# Patient Record
Sex: Male | Born: 1975
Health system: Southern US, Community
[De-identification: ages and names within clinical notes are randomized; demographics above are authoritative.]

## PROBLEM LIST (undated history)

## (undated) DIAGNOSIS — K4090 Unilateral inguinal hernia, without obstruction or gangrene, not specified as recurrent: Secondary | ICD-10-CM

---

## 2003-12-23 ENCOUNTER — Emergency Department (HOSPITAL_COMMUNITY): Admission: EM | Admit: 2003-12-23 | Discharge: 2003-12-23 | Payer: Self-pay | Admitting: Emergency Medicine

## 2016-02-04 ENCOUNTER — Emergency Department (HOSPITAL_COMMUNITY)
Admission: EM | Admit: 2016-02-04 | Discharge: 2016-02-05 | Disposition: A | Discharge status: Home / Self Care | Payer: 59 | Attending: Emergency Medicine | Admitting: Emergency Medicine

## 2016-02-04 ENCOUNTER — Encounter (HOSPITAL_COMMUNITY): Payer: Self-pay | Admitting: *Deleted

## 2016-02-04 DIAGNOSIS — F17200 Nicotine dependence, unspecified, uncomplicated: Secondary | ICD-10-CM | POA: Insufficient documentation

## 2016-02-04 DIAGNOSIS — R1013 Epigastric pain: Secondary | ICD-10-CM | POA: Insufficient documentation

## 2016-02-04 DIAGNOSIS — R112 Nausea with vomiting, unspecified: Secondary | ICD-10-CM | POA: Diagnosis present

## 2016-02-04 DIAGNOSIS — R1010 Upper abdominal pain, unspecified: Secondary | ICD-10-CM | POA: Diagnosis not present

## 2016-02-04 DIAGNOSIS — R634 Abnormal weight loss: Secondary | ICD-10-CM | POA: Insufficient documentation

## 2016-02-04 DIAGNOSIS — R197 Diarrhea, unspecified: Secondary | ICD-10-CM | POA: Diagnosis not present

## 2016-02-04 LAB — COMPREHENSIVE METABOLIC PANEL
ALT: 18 U/L (ref 17–63)
AST: 19 U/L (ref 15–41)
Albumin: 4.1 g/dL (ref 3.5–5.0)
Alkaline Phosphatase: 55 U/L (ref 38–126)
Anion gap: 8 (ref 5–15)
BUN: 6 mg/dL (ref 6–20)
CO2: 25 mmol/L (ref 22–32)
Calcium: 8.9 mg/dL (ref 8.9–10.3)
Chloride: 106 mmol/L (ref 101–111)
Creatinine, Ser: 0.93 mg/dL (ref 0.61–1.24)
GFR calc Af Amer: >60 mL/min (ref >60)
GFR calc non Af Amer: >60 mL/min (ref >60)
Glucose, Bld: 119 mg/dL — ABNORMAL HIGH (ref 65–99)
Potassium: 3.8 mmol/L (ref 3.5–5.1)
Sodium: 139 mmol/L (ref 135–145)
Total Bilirubin: 0.5 mg/dL (ref 0.3–1.2)
Total Protein: 6.9 g/dL (ref 6.5–8.1)

## 2016-02-04 LAB — CBC
HCT: 40.5 % (ref 39.0–52.0)
Hemoglobin: 13.2 g/dL (ref 13.0–17.0)
MCH: 30.2 pg (ref 26.0–34.0)
MCHC: 32.6 g/dL (ref 30.0–36.0)
MCV: 92.7 fL (ref 78.0–100.0)
Platelets: 216 10*3/uL (ref 150–400)
RBC: 4.37 MIL/uL (ref 4.22–5.81)
RDW: 13.2 % (ref 11.5–15.5)
WBC: 8.9 10*3/uL (ref 4.0–10.5)

## 2016-02-04 LAB — URINALYSIS, ROUTINE W REFLEX MICROSCOPIC
BILIRUBIN URINE: NEGATIVE
GLUCOSE, UA: NEGATIVE mg/dL
KETONES UR: NEGATIVE mg/dL
Leukocytes, UA: NEGATIVE
Nitrite: NEGATIVE
PROTEIN: NEGATIVE mg/dL
Specific Gravity, Urine: 1.002 — ABNORMAL LOW (ref 1.005–1.030)
pH: 6.5 (ref 5.0–8.0)

## 2016-02-04 LAB — URINE MICROSCOPIC-ADD ON
BACTERIA UA: NONE SEEN
WBC, UA: NONE SEEN WBC/hpf (ref 0–5)

## 2016-02-04 LAB — LIPASE, BLOOD: Lipase: 25 U/L (ref 11–51)

## 2016-02-04 MED ORDER — SODIUM CHLORIDE 0.9 % IV BOLUS (SEPSIS)
500.0000 mL | Freq: Once | INTRAVENOUS | Status: AC
  Administered 2016-02-05: 500 mL via INTRAVENOUS

## 2016-02-04 NOTE — ED Notes (Signed)
The pt is c/o abd pain with nausea and diarrhea since march 1st he is actually better now minimal pain he reports feeling something moving in his abd.  He also wants a tet shot  Because he gets small cuts all the time and it been A LONG TIME since he had one

## 2016-02-05 ENCOUNTER — Encounter (HOSPITAL_COMMUNITY): Payer: Self-pay | Admitting: Radiology

## 2016-02-05 ENCOUNTER — Emergency Department (HOSPITAL_COMMUNITY): Payer: 59

## 2016-02-05 MED ORDER — IOHEXOL 300 MG/ML  SOLN
100.0000 mL | Freq: Once | INTRAMUSCULAR | Status: AC | PRN
  Administered 2016-02-05: 100 mL via INTRAVENOUS

## 2016-02-05 MED ORDER — HYDROXYZINE HCL 25 MG PO TABS: 25.0000 mg | ORAL_TABLET | Freq: Three times a day (TID) | ORAL | Status: DC | PRN

## 2016-02-05 MED ORDER — CHLORDIAZEPOXIDE HCL 25 MG PO CAPS: ORAL_CAPSULE | ORAL | Status: DC

## 2016-02-05 MED ORDER — ONDANSETRON 4 MG PO TBDP: 4.0000 mg | ORAL_TABLET | Freq: Three times a day (TID) | ORAL | Status: DC | PRN

## 2016-02-05 NOTE — Discharge Instructions (Signed)
Abdominal Pain, Adult °Many things can cause abdominal pain. Usually, abdominal pain is not caused by a disease and will improve without treatment. It can often be observed and treated at home. Your health care provider will do a physical exam and possibly order blood tests and X-rays to help determine the seriousness of your pain. However, in many cases, more time must pass before a clear cause of the pain can be found. Before that point, your health care provider may not know if you need more testing or further treatment. °HOME CARE INSTRUCTIONS °Monitor your abdominal pain for any changes. The following actions may help to alleviate any discomfort you are experiencing: °· Only take over-the-counter or prescription medicines as directed by your health care provider. °· Do not take laxatives unless directed to do so by your health care provider. °· Try a clear liquid diet (broth, tea, or water) as directed by your health care provider. Slowly move to a bland diet as tolerated. °SEEK MEDICAL CARE IF: °· You have unexplained abdominal pain. °· You have abdominal pain associated with nausea or diarrhea. °· You have pain when you urinate or have a bowel movement. °· You experience abdominal pain that wakes you in the night. °· You have abdominal pain that is worsened or improved by eating food. °· You have abdominal pain that is worsened with eating fatty foods. °· You have a fever. °SEEK IMMEDIATE MEDICAL CARE IF: °· Your pain does not go away within 2 hours. °· You keep throwing up (vomiting). °· Your pain is felt only in portions of the abdomen, such as the right side or the left lower portion of the abdomen. °· You pass bloody or black tarry stools. °MAKE SURE YOU: °· Understand these instructions. °· Will watch your condition. °· Will get help right away if you are not doing well or get worse. °  °This information is not intended to replace advice given to you by your health care provider. Make sure you discuss  any questions you have with your health care provider. °  °Document Released: 08/04/2005 Document Revised: 07/16/2015 Document Reviewed: 07/04/2013 °Elsevier Interactive Patient Education ©2016 Elsevier Inc. ° °Nausea and Vomiting °Nausea is a sick feeling that often comes before throwing up (vomiting). Vomiting is a reflex where stomach contents come out of your mouth. Vomiting can cause severe loss of body fluids (dehydration). Children and elderly adults can become dehydrated quickly, especially if they also have diarrhea. Nausea and vomiting are symptoms of a condition or disease. It is important to find the cause of your symptoms. °CAUSES  °· Direct irritation of the stomach lining. This irritation can result from increased acid production (gastroesophageal reflux disease), infection, food poisoning, taking certain medicines (such as nonsteroidal anti-inflammatory drugs), alcohol use, or tobacco use. °· Signals from the brain. These signals could be caused by a headache, heat exposure, an inner ear disturbance, increased pressure in the brain from injury, infection, a tumor, or a concussion, pain, emotional stimulus, or metabolic problems. °· An obstruction in the gastrointestinal tract (bowel obstruction). °· Illnesses such as diabetes, hepatitis, gallbladder problems, appendicitis, kidney problems, cancer, sepsis, atypical symptoms of a heart attack, or eating disorders. °· Medical treatments such as chemotherapy and radiation. °· Receiving medicine that makes you sleep (general anesthetic) during surgery. °DIAGNOSIS °Your caregiver may ask for tests to be done if the problems do not improve after a few days. Tests may also be done if symptoms are severe or if the reason for the   nausea and vomiting is not clear. Tests may include:  Urine tests.  Blood tests.  Stool tests.  Cultures (to look for evidence of infection).  X-rays or other imaging studies. Test results can help your caregiver make  decisions about treatment or the need for additional tests. TREATMENT You need to stay well hydrated. Drink frequently but in small amounts.You may wish to drink water, sports drinks, clear broth, or eat frozen ice pops or gelatin dessert to help stay hydrated.When you eat, eating slowly may help prevent nausea.There are also some antinausea medicines that may help prevent nausea. HOME CARE INSTRUCTIONS   Take all medicine as directed by your caregiver.  If you do not have an appetite, do not force yourself to eat. However, you must continue to drink fluids.  If you have an appetite, eat a normal diet unless your caregiver tells you differently.  Eat a variety of complex carbohydrates (rice, wheat, potatoes, bread), lean meats, yogurt, fruits, and vegetables.  Avoid high-fat foods because they are more difficult to digest.  Drink enough water and fluids to keep your urine clear or pale yellow.  If you are dehydrated, ask your caregiver for specific rehydration instructions. Signs of dehydration may include:  Severe thirst.  Dry lips and mouth.  Dizziness.  Dark urine.  Decreasing urine frequency and amount.  Confusion.  Rapid breathing or pulse. SEEK IMMEDIATE MEDICAL CARE IF:   You have blood or brown flecks (like coffee grounds) in your vomit.  You have black or bloody stools.  You have a severe headache or stiff neck.  You are confused.  You have severe abdominal pain.  You have chest pain or trouble breathing.  You do not urinate at least once every 8 hours.  You develop cold or clammy skin.  You continue to vomit for longer than 24 to 48 hours.  You have a fever. MAKE SURE YOU:   Understand these instructions.  Will watch your condition.  Will get help right away if you are not doing well or get worse.   This information is not intended to replace advice given to you by your health care provider. Make sure you discuss any questions you have with  your health care provider.   Document Released: 10/25/2005 Document Revised: 01/17/2012 Document Reviewed: 03/24/2011 Elsevier Interactive Patient Education 2016 [employer].   [employer] Guide Outpatient Counseling/Substance Abuse Adult The United Ways 211 is a great source of information about community services available.  Access by dialing 2-1-1 from anywhere in [place], or by website -  PooledIncome.pl.   Other Local Resources (Updated 11/2015)  Crisis Hotlines   Services     Area Served  [employer]  Crisis Hotline, available 24 hours a day, 7 days a week: (734) 380-6346 [facility], [place]   [facility]  Crisis Hotline, available 24 hours a day, 7 days a week: (847)566-8980 [facility], [place]  [facility]  Suicide Prevention Hotline, available 24 hours a day, 7 days a week: 906-560-3587 [facility], [place]  [employer], available 24 hours a day, 7 days a week: 907-629-8364 [facility], [place]   [facility] Access to [employer], available 24 hours a day, 7 days a week: 365-589-7668 All   [facility]  Crisis Hotline, available 24 hours a day, 7 days a week: 6263448709 All   Other Local Resources (Updated 11/2015)  Outpatient Counseling/ Substance Abuse Programs  Services     Address  and Phone Number  ADS ([facility]cohol and Drug Services)   Options include Individual counseling, group counseling, intensive outpatient program (several hours a day, several days a week)  Offers depression assessments  Provides methadone maintenance program [phone] [address] [place], [place] [address]   [facility]   Offers partial hospitalization/day treatment and DUI/DWI programs  [employer], private insurance [phone] [address] [place], [place] [address]  [facility]    Services include intensive outpatient program  (several hours a day, several days a week), outpatient treatment, DUI/DWI services, family education  Also has some services specifically for [employer] transitional housing  [phone] [address] [place], [place] [address]     [facility]  [employer], private pay, and private insurance [phone] [address] [place], [place] [address]  [employer] [facility]  Services include individual counseling, substance abuse intensive outpatient program (several hours a day, several days a week), day treatment  Delene Loll, Medicaid, private insurance [phone] [address] [place], [place] [address]  [facility]   Offers substance abuse intensive outpatient program (several hours a day, several days a week), partial hospitalization program [phone] [address] [place], [place] [address]  [phone] [address] [place], [place] [address]  [phone] [address] [place], [place] [address]  [phone] [id number] [place], [place] [address]  [facility]  Individual counseling only  Accepts private insurance only [phone] [address] [place], [place] [address]  [facility]  Methadone maintenance program [phone] [address] [place], [place] [address]  [facility]  Walk-In Clinic providing substance abuse and mental health counseling  Accepts Medicaid, Medicare, private insurance  Offers sliding scale for uninsured [phone] [address] [place], [place]   [facility] [place], [employer].  Offers individual counseling, and intensive in-home services [phone] [address] [place], [place] [address]  [facility] individual counseling, family counseling, group therapy, domestic violence counseling, consumer credit  counseling  Accepts Medicare, Medicaid, private insurance  Offers sliding scale for uninsured [phone] [address] [place], [place] [address]  [phone] [facility], [address] [place], [place] [address]  [facility]  Offers individual, family and group counseling  3 locations - [place], [place], and [place]  [phone]  [address] [place], [place] [address]  [address] [place], [place] [address]  [address] [place], [place] [address]  [facility]    Offers psychiatric assessment, 8-week Intensive Outpatient Program (several hours a day, several times a week, daytime or evenings), early recovery group, family Program, medication management  Private pay or private insurance only [phone], or  [phone] [address] [place], [place] [address]  [facility] [employer] individual, couples and family counseling  Accepts Medicaid, private insurance, and sliding scale for uninsured [phone] [address] [place], [place] [address]  Len Blalock, MD  Individual counseling  Private insurance [phone] [address] [place], [place] [address]  [facility]   Offers assessment, substance abuse treatment, and behavioral health treatment [phone] [address] [place], [place] [address]  [facility]  Individual counseling  Accepts private insurance [phone] [address] [place], [place] [address]  [facility]  Individual counseling  Delene Loll, private insurance [phone] [address] [place], [place]  [phone]  [facility]    Offers intensive outpatient program (several hours a day, several times a week)  Private pay, private insurance [phone] [facility] [address] [place], [place]  [facility]  Individual counseling  Medicare, private insurance [phone] [address], [address] [place], [place] [address]  [facility]    Offers intensive outpatient program (several hours a day, several times a week) and partial hospitalization program [phone] [address] [place], [place] [address]  Emerson MonteParrish McKinney, MD  Individual counseling [phone] [address] [place], [place] [address]  [facility]  Offers Christian counseling to individuals, couples, and families  Accepts Medicare and private insurance; offers sliding scale for uninsured [phone] [address] [place], [place] [address]  [facility] [phone] [address] [place], [place] [address]  [facility]   Offers crisis counseling, individual counseling, group therapy, in-home therapy, domestic violence services, day treatment, DWI services, Administrator, artsCommunity Support Team (CST), Doctor, hospitalAssertive Community Treatment Team (ACTT), substance abuse Intensive Outpatient Program (several hours a day, several times a week)  2 locations - [place] and [place] [phone] [address] [place], [place] [address]  [phone] [address] [place], [place] [address]  [facility]     Individual counseling and group therapy  [employer] private insurance, [place]icare, [place] [phone] [address] [place], [place]  [facility]  Offers individual and family counseling  Offers LGBTQ services  Accepts private insurance and private pay [phone] [address] [place], [place] [id number]  [facility]    Offers individual counseling, group therapy, and outpatient detox  Accepts private insurance [phone] [address] [place], [place]  [facility]  Individual counseling  Accepts Medicare, private insurance [phone] [address] [place], [place] [address]   [facility]  Individual counseling  Accepts Medicare, private insurance [phone] [address] [place], [place] [address]  [facility]   Offers substance abuse Intensive Outpatient Program (several hours a day, several times a week) [phone], or [phone] [address] [place], [place]

## 2016-02-05 NOTE — ED Provider Notes (Signed)
CSN: [id number]     Arrival date & time 02/04/16  2144 History   First MD Initiated Contact with Patient 02/04/16 2348     Chief Complaint  Patient presents with  . Abdominal Pain     Patient is a 40 y.o. male presenting with abdominal pain. The history is provided by the patient.  Abdominal Pain Associated symptoms: diarrhea, nausea and vomiting   Associated symptoms: no chest pain and no shortness of breath   Patient presents with upper abdominal pain. States he's had nausea and diarrhea over the last 3 weeks the pain began today. Pain is in her upper abdomen. States he had felt constipated but now has more diarrhea. States he's had nausea and dry heaves. No fevers or chills. States he has felt fatigue. Pain is dull and in the upper abdomen. It is somewhat worse with eating. He states that over the last couple years he has lost 50 pounds. States he has not been trying. States he has had some depression has not been eating as much though. States he probably is drinking more than he should also.Marland Kitchen  History reviewed. No pertinent past medical history. History reviewed. No pertinent past surgical history. No family history on file. Social History  Substance Use Topics  . Smoking status: Current Every Day Smoker  . Smokeless tobacco: None  . Alcohol Use: Yes    Review of Systems  Constitutional: Positive for appetite change and unexpected weight change. Negative for activity change.  Eyes: Negative for pain.  Respiratory: Negative for chest tightness and shortness of breath.   Cardiovascular: Negative for chest pain and leg swelling.  Gastrointestinal: Positive for nausea, vomiting, abdominal pain and diarrhea.  Genitourinary: Negative for flank pain.  Musculoskeletal: Negative for back pain and neck stiffness.  Skin: Negative for rash.  Neurological: Negative for weakness, numbness and headaches.  Psychiatric/Behavioral: Negative for behavioral problems.      Allergies  Review of  patient's allergies indicates no known allergies.  Home Medications   Prior to Admission medications   Not on File   BP 109/70 mmHg  Pulse 63  Temp(Src) 98.1 F (36.7 C) (Oral)  Resp 20  Wt 173 lb (78.472 kg)  SpO2 100% Physical Exam  Constitutional: He appears well-developed and well-nourished.  HENT:  Head: Atraumatic.  Neck: Neck supple.  Cardiovascular: Normal rate.   Pulmonary/Chest: Effort normal.  Abdominal: There is tenderness.  Epigastric tenderness with some right upper quadrant fullness. No rebound or guarding.  Musculoskeletal: He exhibits no edema.  Neurological: He is alert.  Skin: Skin is warm.    ED Course  Procedures (including critical care time) Labs Review Labs Reviewed  COMPREHENSIVE METABOLIC PANEL - Abnormal; Notable for the following:    Glucose, Bld 119 (*)    All other components within normal limits  URINALYSIS, ROUTINE W REFLEX MICROSCOPIC (NOT AT [facility]) - Abnormal; Notable for the following:    APPearance CLOUDY (*)    Specific Gravity, Urine 1.002 (*)    Hgb urine dipstick TRACE (*)    All other components within normal limits  URINE MICROSCOPIC-ADD ON - Abnormal; Notable for the following:    Squamous Epithelial / LPF 0-5 (*)    All other components within normal limits  LIPASE, BLOOD  CBC    Imaging Review No results found. I have personally reviewed and evaluated these images and lab results as part of my medical decision-making.   EKG Interpretation None      MDM  Final diagnoses:  Nausea vomiting and diarrhea  Epigastric pain    Patient with nausea vomiting diarrhea. Epigastric abdominal pain with some fullness in the right upper quadrant. He is slender and this may just be his liver, but with unexpected weight loss of 50 pounds will get CT scan. Care turned over to Dr. Elesa MassedWard.    Benjiman CoreNathan Anastasija Anfinson, MD 02/05/16 [phone]

## 2016-12-06 ENCOUNTER — Encounter (HOSPITAL_COMMUNITY): Payer: Self-pay

## 2016-12-06 DIAGNOSIS — L03211 Cellulitis of face: Secondary | ICD-10-CM | POA: Insufficient documentation

## 2016-12-06 DIAGNOSIS — F17200 Nicotine dependence, unspecified, uncomplicated: Secondary | ICD-10-CM | POA: Insufficient documentation

## 2016-12-06 DIAGNOSIS — L0100 Impetigo, unspecified: Secondary | ICD-10-CM | POA: Diagnosis not present

## 2016-12-06 DIAGNOSIS — L0889 Other specified local infections of the skin and subcutaneous tissue: Secondary | ICD-10-CM | POA: Diagnosis present

## 2016-12-06 NOTE — ED Triage Notes (Signed)
Pt states hx of staph infection on nose. Pt with redness and swelling to bridge of nose. Pt states recurrent infection. Pt complaining of pain to nose. Pt denies any new injury/trauma. Pt unsure if scratched nose in sleep.

## 2016-12-07 ENCOUNTER — Emergency Department (HOSPITAL_COMMUNITY)
Admission: EM | Admit: 2016-12-07 | Discharge: 2016-12-07 | Disposition: A | Discharge status: Home / Self Care | Payer: 59 | Attending: Emergency Medicine | Admitting: Emergency Medicine

## 2016-12-07 DIAGNOSIS — L0100 Impetigo, unspecified: Secondary | ICD-10-CM

## 2016-12-07 DIAGNOSIS — L03211 Cellulitis of face: Secondary | ICD-10-CM

## 2016-12-07 MED ORDER — CEPHALEXIN 500 MG PO CAPS: 500.0000 mg | ORAL_CAPSULE | Freq: Three times a day (TID) | ORAL | 0 refills | Status: DC

## 2016-12-07 MED ORDER — CEPHALEXIN 250 MG PO CAPS
500.0000 mg | ORAL_CAPSULE | Freq: Once | ORAL | Status: AC
  Administered 2016-12-07: 500 mg via ORAL
  Filled 2016-12-07: qty 2

## 2016-12-07 MED ORDER — MUPIROCIN CALCIUM 2 % NA OINT: TOPICAL_OINTMENT | NASAL | 0 refills | Status: DC

## 2016-12-07 MED ORDER — SULFAMETHOXAZOLE-TRIMETHOPRIM 800-160 MG PO TABS
1.0000 | ORAL_TABLET | Freq: Once | ORAL | Status: AC
  Administered 2016-12-07: 1 via ORAL
  Filled 2016-12-07: qty 1

## 2016-12-07 MED ORDER — SULFAMETHOXAZOLE-TRIMETHOPRIM 800-160 MG PO TABS: 1.0000 | ORAL_TABLET | Freq: Two times a day (BID) | ORAL | 0 refills | Status: AC

## 2016-12-07 NOTE — ED Provider Notes (Signed)
[facility]-EMERGENCY DEPT Provider Note   CSN: [id number] Arrival date & time: 12/06/16  2333  By signing my name below, I, Arianna Nassar, attest that this documentation has been prepared under the direction and in the presence of Shon Baton, MD.  Electronically Signed: Octavia Heir, ED Scribe. 12/07/16. 3:09 AM.    History   Chief Complaint Chief Complaint  Patient presents with  . Skin Infection (of nose)    The history is provided by the patient. No language interpreter was used.   HPI Comments: Craig Fitzgerald is a 41 y.o. male who presents to the Emergency Department complaining of a moderate wound infection the nose that began last night. He describes the sensation as itching and burning. There is associated erythema and mild edema to the area. 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 He notes this new onset of infection would not go away when peroxide was applied. There has not been any new trauma or injury to the area. Pt denies fever, chest pain, shortness of breath, or lesions to other parts of the body  History reviewed. No pertinent past medical history.  There are no active problems to display for this patient.   History reviewed. No pertinent surgical history.     Home Medications    Prior to Admission medications   Medication Sig Start Date End Date Taking? Authorizing Provider  cephALEXin (KEFLEX) 500 MG capsule Take 1 capsule (500 mg total) by mouth 3 (three) times daily. 12/07/16   Shon Baton, MD  chlordiazePOXIDE (LIBRIUM) 25 MG capsule 50mg  PO TID x 1D, then 25-50mg  PO BID X 1D, then 25-50mg  PO QD X 1D 02/05/16   Danelle Berry, PA-C  hydrOXYzine (ATARAX/VISTARIL) 25 MG tablet Take 1 tablet (25 mg total) by mouth every 8 (eight) hours as needed for anxiety, nausea or vomiting. 02/05/16   Danelle Berry, PA-C  mupirocin nasal ointment (BACTROBAN) 2 % Apply in each nostril daily 12/07/16   Shon Baton, MD  ondansetron (ZOFRAN ODT) 4 MG disintegrating tablet Take 1 tablet (4 mg total) by mouth every 8 (eight) hours as needed for nausea or vomiting. 02/05/16   Danelle Berry, PA-C  sulfamethoxazole-trimethoprim (BACTRIM DS,SEPTRA DS) 800-160 MG tablet Take 1 tablet by mouth 2 (two) times daily. 12/07/16 12/14/16  Shon Baton, MD    Family History History reviewed. No pertinent family history.  Social History Social History  Substance Use Topics  . Smoking status: Current Every Day Smoker    Types: Pipe  . Smokeless tobacco: Never Used  . Alcohol use Yes     Allergies   Patient has no known allergies.   Review of Systems Review of Systems  Constitutional: Negative for fever.  Respiratory: Negative for shortness of breath.   Cardiovascular: Negative for chest pain.  Skin: Positive for rash.  All other systems reviewed and are negative.    Physical Exam Updated Vital Signs BP 128/83   Pulse 83   Temp 98.4 F (36.9 C) (Oral)   Resp 16   SpO2 100%   Physical Exam  Constitutional: He is oriented to person, place, and time. He appears well-developed and well-nourished. No distress.  HENT:  Head: Normocephalic and atraumatic.  Erythema over the tip of the nose extending over midline, several crusted lesions over the left side of the nose, area is approximately quarter size, no additional lesions noted over the face  Cardiovascular: Normal rate, regular rhythm and  normal heart sounds.   No murmur heard. Pulmonary/Chest: Effort normal and breath sounds normal. No respiratory distress. He has no wheezes.  Neurological: He is alert and oriented to person, place, and time.  Skin: Skin is warm and dry.  Psychiatric: He has a normal mood and affect.  Nursing note and vitals reviewed.    ED Treatments / Results  DIAGNOSTIC STUDIES: Oxygen Saturation is 100% on RA, normal by my interpretation.  COORDINATION OF CARE:  3:09 AM Discussed treatment plan with pt at  bedside and pt agreed to plan.  Labs (all labs ordered are listed, but only abnormal results are displayed) Labs Reviewed - No data to display  EKG  EKG Interpretation None       Radiology No results found.  Procedures Procedures (including critical care time)  Medications Ordered in ED Medications  cephALEXin (KEFLEX) capsule 500 mg (not administered)  sulfamethoxazole-trimethoprim (BACTRIM DS,SEPTRA DS) 800-160 MG per tablet 1 tablet (not administered)     Initial Impression / Assessment and Plan / ED Course  I have reviewed the triage vital signs and the nursing notes.  Pertinent labs & imaging results that were available during my care of the patient were reviewed by me and considered in my medical decision making (see chart for details).     Patient presents with redness over the tip of the nose. No systemic symptoms. Likely cellulitis versus impetigo. History of the same. We'll place on Keflex and Bactrim. No other lesions suggestive of shingles. Redness does also cross midline. Patient is concerned that he has recurrent outbreaks similar to this. He will be provided Bactroban for likely colonization.  After history, exam, and medical workup I feel the patient has been appropriately medically screened and is safe for discharge home. Pertinent diagnoses were discussed with the patient. Patient was given return precautions.   Final Clinical Impressions(s) / ED Diagnoses   Final diagnoses:  Cellulitis of face  Impetigo   I personally performed the services described in this documentation, which was scribed in my presence. The recorded information has been reviewed and is accurate.  New Prescriptions New Prescriptions   CEPHALEXIN (KEFLEX) 500 MG CAPSULE    Take 1 capsule (500 mg total) by mouth 3 (three) times daily.   MUPIROCIN NASAL OINTMENT (BACTROBAN) 2 %    Apply in each nostril daily   SULFAMETHOXAZOLE-TRIMETHOPRIM (BACTRIM DS,SEPTRA DS) 800-160 MG TABLET     Take 1 tablet by mouth 2 (two) times daily.     Shon Batonourtney F Horton, MD 12/07/16 [phone]

## 2016-12-07 NOTE — Discharge Instructions (Signed)
You were seen today for likely an infection of the tip of her nose. He will be given oral antibiotics as well as a topical antibiotic to apply within your nares. If you develop fevers, worsening or spreading redness, you need to be reevaluated.

## 2016-12-16 ENCOUNTER — Ambulatory Visit: Payer: 59 | Attending: Internal Medicine | Admitting: Physician Assistant

## 2016-12-16 ENCOUNTER — Encounter: Payer: Self-pay | Admitting: Physician Assistant

## 2016-12-16 VITALS — BP 121/83 | HR 74 | Temp 98.1°F | Resp 16 | Wt 163.2 lb

## 2016-12-16 DIAGNOSIS — L03211 Cellulitis of face: Secondary | ICD-10-CM | POA: Insufficient documentation

## 2016-12-16 NOTE — Progress Notes (Signed)
Patient ID: Craig Fitzgerald, male   DOB: [birth year], 41 y.o.   MRN: [id number]   Mandy Eliseo Fitzgerald, is a 41 y.o. male  [id number]:[id number]  [id number]  DOB - [birth year]  Subjective:  Chief Complaint and HPI: 59Muss[age] Eliseo Fitzgerald is a 41 y.o. male here today to establish care and for a follow up visit after being seen in the ED 12/07/2016 for cellulitis of the face from a wound around the nose.  He was treated with Cephalexin and Septra and placed on Bactroban ointment intranasally for colonization.  He says this all started in 2004.  He has had recurrent infections on the L side of his nose on and off for years since then.  Sometimes the infections have required antibiotic treatment and sometimes they haven't.  He just finished both antibiotics by mouth.  He continues to use the bactroban intranasally.  No f/c.  He says that he may go a year or more between infections.  When he does not have an infection, the skin goes completely back to normal.  He denies any underlying lesion or abnormality.  Denies any previous health problems.  Requesting a note to be out of work for another week.   ED/Hospital notes reviewed.     ROS:   Constitutional:  No f/c, No night sweats, No unexplained weight loss. EENT:  No vision changes, No blurry vision, No hearing changes. No mouth, throat, or ear problems. +resolving nasal infection Respiratory: No cough, No SOB Cardiac: No CP, no palpitations GI:  No abd pain, No N/V/D. GU: No Urinary s/sx Musculoskeletal: No joint pain Neuro: No headache, no dizziness, no motor weakness.  Skin: No rash Endocrine:  No polydipsia. No polyuria.  Psych: Denies SI/HI  No problems updated.  ALLERGIES: No Known Allergies  PAST MEDICAL HISTORY: No past medical history on file.  MEDICATIONS AT HOME: Prior to Admission medications   Medication Sig Start Date End Date Taking? Authorizing Provider  cephALEXin (KEFLEX) 500 MG capsule Take 1 capsule (500 mg total) by mouth 3 (three)  times daily. Patient not taking: Reported on 12/16/2016 12/07/16   Shon Batonourtney F Horton, MD  chlordiazePOXIDE (LIBRIUM) 25 MG capsule 50mg  PO TID x 1D, then 25-50mg  PO BID X 1D, then 25-50mg  PO QD X 1D Patient not taking: Reported on 12/16/2016 02/05/16   Danelle BerryLeisa Tapia, PA-C  hydrOXYzine (ATARAX/VISTARIL) 25 MG tablet Take 1 tablet (25 mg total) by mouth every 8 (eight) hours as needed for anxiety, nausea or vomiting. Patient not taking: Reported on 12/16/2016 02/05/16   Danelle BerryLeisa Tapia, PA-C  mupirocin nasal ointment (BACTROBAN) 2 % Apply in each nostril daily Patient not taking: Reported on 12/16/2016 12/07/16   Shon Batonourtney F Horton, MD  ondansetron (ZOFRAN ODT) 4 MG disintegrating tablet Take 1 tablet (4 mg total) by mouth every 8 (eight) hours as needed for nausea or vomiting. Patient not taking: Reported on 12/16/2016 02/05/16   Danelle BerryLeisa Tapia, PA-C     Objective:  EXAM:   Vitals:   12/16/16 1519  BP: 121/83  Pulse: 74  Resp: 16  Temp: 98.1 F (36.7 C)  TempSrc: Oral  SpO2: 96%  Weight: 163 lb 3.2 oz (74 kg)    General appearance : A&OX3. NAD. Non-toxic-appearing HEENT: Atraumatic and Normocephalic.  PERRLA. EOM intact.  L nare with <1cm scabbed over area without active erythema or drainage. Neck: supple, no JVD. No cervical lymphadenopathy. No thyromegaly Chest/Lungs:  Breathing-non-labored, Good air entry bilaterally, breath sounds normal without rales, rhonchi, or wheezing  CVS: S1  S2 regular, no murmurs, gallops, rubs  Extremities: Bilateral Lower Ext shows no edema, both legs are warm to touch with = pulse throughout Neurology:  CN II-XII grossly intact, Non focal.   Psych:  TP linear. J/I WNL. Normal speech. Appropriate eye contact and affect.  Skin:  No Rash  Data Review No results found for: HGBA1C   Assessment & Plan   1. Cellulitis, face OOW note though Monday to allow for additional healing.   Patient have been counseled extensively about nutrition and exercise  Return in about 2  weeks (around 12/30/2016) for assign PCP and CPE with fasting blood work.  The patient was given clear instructions to go to ER or return to medical center if symptoms don't improve, worsen or new problems develop. The patient verbalized understanding. The patient was told to call to get lab results if they haven't heard anything in the next week.     Georgian Co, PA-C [facility] [place], [place] [phone]   12/16/2016, 3:31 PM

## 2017-11-06 ENCOUNTER — Other Ambulatory Visit: Payer: Self-pay

## 2017-11-06 ENCOUNTER — Emergency Department (HOSPITAL_COMMUNITY)
Admission: EM | Admit: 2017-11-06 | Discharge: 2017-11-06 | Disposition: A | Discharge status: Home / Self Care | Payer: 59 | Attending: Emergency Medicine | Admitting: Emergency Medicine

## 2017-11-06 ENCOUNTER — Encounter (HOSPITAL_COMMUNITY): Payer: Self-pay | Admitting: Emergency Medicine

## 2017-11-06 DIAGNOSIS — J3489 Other specified disorders of nose and nasal sinuses: Secondary | ICD-10-CM | POA: Insufficient documentation

## 2017-11-06 MED ORDER — MUPIROCIN CALCIUM 2 % NA OINT: TOPICAL_OINTMENT | NASAL | 0 refills | Status: DC

## 2017-11-06 MED ORDER — OXYMETAZOLINE HCL 0.05 % NA SOLN
1.0000 | Freq: Once | NASAL | Status: AC
  Administered 2017-11-06: 1 via NASAL
  Filled 2017-11-06: qty 15

## 2017-11-06 MED ORDER — CEPHALEXIN 500 MG PO CAPS: 500.0000 mg | ORAL_CAPSULE | Freq: Four times a day (QID) | ORAL | 0 refills | Status: DC

## 2017-11-06 NOTE — ED Triage Notes (Signed)
Patient presents ambulatory stating he has hx of staph infection in his nose and is having an occurrence. States he is having nasal congestion and usually needs antibiotics to get infection under control.

## 2017-11-06 NOTE — Discharge Instructions (Signed)
Your exam today shows that you have infection of the inside of your nose. Use the nasal spray twice a day for the next 3 days and then stop. Use the ointment in your nose as directed. Take the antibiotic as directed and [facility], call tomorrow to schedule follow up. Return here as needed.

## 2017-11-06 NOTE — ED Provider Notes (Signed)
[facility]-EMERGENCY DEPT Provider Note   CSN: [id number] Arrival date & time: 11/06/17  1901     History   Chief Complaint Chief Complaint  Patient presents with  . Nose Sore    HPI Carry Leda GauzeM Gillson is a 41 y.o. male who presents to the ED for what he thinks is a staph infection. Patient reports hx staph and states when his nose gets like it is now he usually needs an antibiotic before it gets better. Patient was here about a year ago and treated with Keflex and ointment for his nose.   HPI  History reviewed. No pertinent past medical history.  There are no active problems to display for this patient.   History reviewed. No pertinent surgical history.     Home Medications    Prior to Admission medications   Medication Sig Start Date End Date Taking? Authorizing Provider  cephALEXin (KEFLEX) 500 MG capsule Take 1 capsule (500 mg total) by mouth 4 (four) times daily. 11/06/17   Janne NapoleonNeese, Hope M, NP  chlordiazePOXIDE (LIBRIUM) 25 MG capsule 50mg  PO TID x 1D, then 25-50mg  PO BID X 1D, then 25-50mg  PO QD X 1D Patient not taking: Reported on 12/16/2016 02/05/16   Danelle Berryapia, Leisa, PA-C  hydrOXYzine (ATARAX/VISTARIL) 25 MG tablet Take 1 tablet (25 mg total) by mouth every 8 (eight) hours as needed for anxiety, nausea or vomiting. Patient not taking: Reported on 12/16/2016 02/05/16   Danelle Berryapia, Leisa, PA-C  mupirocin nasal ointment Idelle Jo(BACTROBAN) 2 % Apply in each nostril daily 11/06/17   Janne NapoleonNeese, Hope M, NP    Family History No family history on file.  Social History Social History   Tobacco Use  . Smoking status: Current Every Day Smoker    Types: Pipe  . Smokeless tobacco: Never Used  Substance Use Topics  . Alcohol use: Yes  . Drug use: Not on file     Allergies   Patient has no known allergies.   Review of Systems Review of Systems  Constitutional: Negative for chills and fever.  HENT: Positive for congestion and postnasal drip.        Swelling of nasal  mucosa  Eyes: Negative for discharge and redness.  Respiratory: Cough: occasional.   Gastrointestinal: Negative for abdominal pain, nausea and vomiting.  Skin: Positive for wound (inside nose).  Neurological: Negative for syncope and headaches.  Psychiatric/Behavioral: Negative for confusion.     Physical Exam Updated Vital Signs BP 129/81 (BP Location: Right Arm)   Pulse 80   Temp 98.7 F (37.1 C) (Oral)   Resp 18   SpO2 99%   Physical Exam  Constitutional: He appears well-developed and well-nourished. No distress.  HENT:  Head: Atraumatic.  Right Ear: Tympanic membrane normal.  Left Ear: Tympanic membrane normal.  Nose: Mucosal edema and rhinorrhea present.  Nasal mucosa with swelling and erythema. Left nostril tender with discharge.   Eyes: Conjunctivae and EOM are normal. Pupils are equal, round, and reactive to light.  Neck: Normal range of motion. Neck supple.  Cardiovascular: Normal rate and regular rhythm.  Pulmonary/Chest: Effort normal and breath sounds normal.  Abdominal: Soft. There is no tenderness.  Musculoskeletal: Normal range of motion.  Neurological: He is alert.  Skin: Skin is warm and dry.  Psychiatric: He has a normal mood and affect.  Nursing note and vitals reviewed.    ED Treatments / Results  Labs (all labs ordered are listed, but only abnormal results are displayed) Labs Reviewed - No data  to display  Radiology No results found.  Procedures Procedures (including critical care time)  Medications Ordered in ED Medications  oxymetazoline (AFRIN) 0.05 % nasal spray 1 spray (not administered)     Initial Impression / Assessment and Plan / ED Course  I have reviewed the triage vital signs and the nursing notes. 41 y.o. male with infected nasal mucosa and nasal congestion stable for d/c without fever, facial swelling, erythema of face and does not appear toxic. Will treat for infection and patient [facility].    Final Clinical Impressions(s) / ED Diagnoses   Final diagnoses:  Infection of nose    ED Discharge Orders        Ordered    mupirocin nasal ointment (BACTROBAN) 2 %     11/06/17 1953    cephALEXin (KEFLEX) 500 MG capsule  4 times daily     11/06/17 1953       Kerrie Buffaloeese, Hope Elm[place]M, [place]NP 11/06/17 2013    Rolan BuccoBelfi, Melanie, MD 11/06/17 2059

## 2017-11-16 ENCOUNTER — Encounter (HOSPITAL_COMMUNITY): Payer: Self-pay | Admitting: Family Medicine

## 2017-11-16 ENCOUNTER — Emergency Department (HOSPITAL_COMMUNITY)
Admission: EM | Admit: 2017-11-16 | Discharge: 2017-11-17 | Disposition: A | Discharge status: Home / Self Care | Payer: Self-pay | Attending: Emergency Medicine | Admitting: Emergency Medicine

## 2017-11-16 DIAGNOSIS — J069 Acute upper respiratory infection, unspecified: Secondary | ICD-10-CM | POA: Insufficient documentation

## 2017-11-16 DIAGNOSIS — F17210 Nicotine dependence, cigarettes, uncomplicated: Secondary | ICD-10-CM | POA: Insufficient documentation

## 2017-11-16 DIAGNOSIS — Z720 Tobacco use: Secondary | ICD-10-CM

## 2017-11-16 DIAGNOSIS — Z79899 Other long term (current) drug therapy: Secondary | ICD-10-CM | POA: Insufficient documentation

## 2017-11-16 HISTORY — DX: Unilateral inguinal hernia, without obstruction or gangrene, not specified as recurrent: K40.90

## 2017-11-16 MED ORDER — ALBUTEROL SULFATE HFA 108 (90 BASE) MCG/ACT IN AERS
1.0000 | INHALATION_SPRAY | RESPIRATORY_TRACT | Status: DC | PRN
  Administered 2017-11-17: 2 via RESPIRATORY_TRACT
  Filled 2017-11-16: qty 6.7

## 2017-11-16 NOTE — ED Provider Notes (Signed)
[facility]-EMERGENCY [facility] Provider Note   CSN: [id number] Arrival date & time: 11/16/17  2244     History   Chief Complaint Chief Complaint  Patient presents with  . URI    HPI Craig Fitzgerald is a 42 y.o. male.  Pt presents to the ED today with sob.  The pt has a hx of a mrsa in his nose.  He was seen here on 12/30 for the nose and was d/c home with keflex and mupirocin.  The pt said the nose has improved, but he has sob with cough.  The pt said he is worried the staph has gone into his lungs.  Pt does smoke.      Past Medical History:  Diagnosis Date  . Hernia, inguinal, left     There are no active problems to display for this patient.   History reviewed. No pertinent surgical history.     Home Medications    Prior to Admission medications   Medication Sig Start Date End Date Taking? Authorizing Provider  cephALEXin (KEFLEX) 500 MG capsule Take 1 capsule (500 mg total) by mouth 4 (four) times daily. 11/06/17   Janne Napoleon, NP  chlordiazePOXIDE (LIBRIUM) 25 MG capsule 50mg  PO TID x 1D, then 25-50mg  PO BID X 1D, then 25-50mg  PO QD X 1D Patient not taking: Reported on 12/16/2016 02/05/16   Danelle Berry, PA-C  hydrOXYzine (ATARAX/VISTARIL) 25 MG tablet Take 1 tablet (25 mg total) by mouth every 8 (eight) hours as needed for anxiety, nausea or vomiting. Patient not taking: Reported on 12/16/2016 02/05/16   Danelle Berry, PA-C  mupirocin nasal ointment Idelle Jo) 2 % Apply in each nostril daily 11/06/17   Janne Napoleon, NP    Family History History reviewed. No pertinent family history.  Social History Social History   Tobacco Use  . Smoking status: Current Every Day Smoker    Types: Pipe  . Smokeless tobacco: Never Used  Substance Use Topics  . Alcohol use: Yes    Comment: Once a week.   . Drug use: Yes    Types: Marijuana    Comment: Last used: Week ago     Allergies   Patient has no known allergies.   Review of Systems Review of  Systems  Respiratory: Positive for cough and shortness of breath.   All other systems reviewed and are negative.    Physical Exam Updated Vital Signs BP 116/77 (BP Location: Left Arm)   Pulse 60   Temp 98.4 F (36.9 C) (Oral)   Resp 18   Ht 5\' 5"  (1.651 m)   Wt 74.8 kg (165 lb)   SpO2 97%   BMI 27.46 kg/m   Physical Exam  Constitutional: He is oriented to person, place, and time. He appears well-developed and well-nourished.  HENT:  Head: Normocephalic and atraumatic.  Right Ear: External ear normal.  Left Ear: External ear normal.  Nose: Nose normal.  Mouth/Throat: Oropharynx is clear and moist.  Eyes: Conjunctivae and EOM are normal. Pupils are equal, round, and reactive to light.  Neck: Normal range of motion. Neck supple.  Cardiovascular: Normal rate, regular rhythm, normal heart sounds and intact distal pulses.  Pulmonary/Chest: Effort normal and breath sounds normal.  Abdominal: Soft. Bowel sounds are normal.  Musculoskeletal: Normal range of motion.  Neurological: He is alert and oriented to person, place, and time.  Skin: Skin is warm and dry. Capillary refill takes less than 2 seconds.  Psychiatric: He has a  normal mood and affect. His behavior is normal. Judgment and thought content normal.  Nursing note and vitals reviewed.    ED Treatments / Results  Labs (all labs ordered are listed, but only abnormal results are displayed) Labs Reviewed - No data to display  EKG  EKG Interpretation None       Radiology Dg Chest 2 View  Result Date: 11/17/2017 CLINICAL DATA:  Cough, dyspnea and fever. EXAM: CHEST  2 VIEW COMPARISON:  None. FINDINGS: The heart size and mediastinal contours are within normal limits. Both lungs are clear. The visualized skeletal structures are unremarkable. IMPRESSION: No active cardiopulmonary disease. Electronically Signed   By: Tollie Ethavid  Kwon M.D.   On: 11/17/2017 00:33    Procedures Procedures (including critical care  time)  Medications Ordered in ED Medications  albuterol (PROVENTIL HFA;VENTOLIN HFA) 108 (90 Base) MCG/ACT inhaler 1-2 puff (2 puffs Inhalation Given 11/17/17 0033)  AEROCHAMBER PLUS FLO-VU MEDIUM MISC 1 each (not administered)     Initial Impression / Assessment and Plan / ED Course  I have reviewed the triage vital signs and the nursing notes.  Pertinent labs & imaging results that were available during my care of the patient were reviewed by me and considered in my medical decision making (see chart for details).     Pt feeling better.  He knows to try to stop smoking and to return if worse.  Final Clinical Impressions(s) / ED Diagnoses   Final diagnoses:  Viral upper respiratory tract infection  Tobacco abuse    ED Discharge Orders    None       Jacalyn LefevreHaviland, Chanel Mckesson, MD 11/17/17 [phone]

## 2017-11-16 NOTE — ED Triage Notes (Signed)
Patient is reporting upper respiratory symptoms of productive/nonproductive cough, nasal drainage, shortness of breath, and fever on Tuesday. Patients respirations are even, regular, and unlabored. Speaks in full sentences.

## 2017-11-17 ENCOUNTER — Emergency Department (HOSPITAL_COMMUNITY): Payer: Self-pay

## 2017-11-17 MED ORDER — AEROCHAMBER PLUS FLO-VU MEDIUM MISC
1.0000 | Freq: Once | Status: AC
  Administered 2017-11-17: 1
  Filled 2017-11-17: qty 1

## 2017-11-30 ENCOUNTER — Inpatient Hospital Stay: Payer: 59 | Admitting: Nurse Practitioner

## 2017-12-04 ENCOUNTER — Encounter (HOSPITAL_COMMUNITY): Payer: Self-pay

## 2017-12-04 ENCOUNTER — Emergency Department (HOSPITAL_COMMUNITY): Payer: Self-pay

## 2017-12-04 ENCOUNTER — Emergency Department (HOSPITAL_COMMUNITY)
Admission: EM | Admit: 2017-12-04 | Discharge: 2017-12-04 | Disposition: A | Discharge status: Home / Self Care | Payer: Self-pay | Attending: Emergency Medicine | Admitting: Emergency Medicine

## 2017-12-04 ENCOUNTER — Other Ambulatory Visit: Payer: Self-pay

## 2017-12-04 DIAGNOSIS — M25512 Pain in left shoulder: Secondary | ICD-10-CM | POA: Insufficient documentation

## 2017-12-04 DIAGNOSIS — F17290 Nicotine dependence, other tobacco product, uncomplicated: Secondary | ICD-10-CM | POA: Insufficient documentation

## 2017-12-04 MED ORDER — ETODOLAC 400 MG PO TABS: 400.0000 mg | ORAL_TABLET | Freq: Two times a day (BID) | ORAL | 0 refills | Status: DC | PRN

## 2017-12-04 NOTE — ED Provider Notes (Signed)
[facility]-EMERGENCY DEPT Provider Note   CSN: [id number] Arrival date & time: 12/04/17  0145     History   Chief Complaint Chief Complaint  Patient presents with  . Shoulder Pain    HPI Craig Fitzgerald is a 42 y.o. male.  Patient presents to the emergency department for evaluation of chest pain, shortness of breath and left-sided neck pain.  Symptoms present for several days.  He denies any direct injury.  He reports that the shoulder hurts worse if he raises his arm.  No numbness, weakness of the extremity.      Past Medical History:  Diagnosis Date  . Hernia, inguinal, left     There are no active problems to display for this patient.   History reviewed. No pertinent surgical history.     Home Medications    Prior to Admission medications   Medication Sig Start Date End Date Taking? Authorizing Provider  etodolac (LODINE) 400 MG tablet Take 1 tablet (400 mg total) by mouth 2 (two) times daily as needed for moderate pain. 12/04/17   Gilda CreasePollina, Larue Lightner J, MD    Family History History reviewed. No pertinent family history.  Social History Social History   Tobacco Use  . Smoking status: Current Every Day Smoker    Types: Pipe  . Smokeless tobacco: Never Used  Substance Use Topics  . Alcohol use: Yes    Comment: Once a week.   . Drug use: Yes    Types: Marijuana    Comment: Last used: Week ago     Allergies   Patient has no known allergies.   Review of Systems Review of Systems  Respiratory: Positive for shortness of breath.   Cardiovascular: Positive for chest pain.  Musculoskeletal: Positive for arthralgias.  All other systems reviewed and are negative.    Physical Exam Updated Vital Signs BP 128/85 (BP Location: Left Arm)   Pulse 65   Temp 98.1 F (36.7 C) (Oral)   Resp 16   Ht 5\' 9"  (1.753 m)   Wt 74.8 kg (165 lb)   SpO2 100%   BMI 24.37 kg/m   Physical Exam  Constitutional: He is oriented to person,  place, and time. He appears well-developed and well-nourished. No distress.  HENT:  Head: Normocephalic and atraumatic.  Right Ear: Hearing normal.  Left Ear: Hearing normal.  Nose: Nose normal.  Mouth/Throat: Oropharynx is clear and moist and mucous membranes are normal.  Eyes: Conjunctivae and EOM are normal. Pupils are equal, round, and reactive to light.  Neck: Normal range of motion. Neck supple.  Cardiovascular: Regular rhythm, S1 normal and S2 normal. Exam reveals no gallop and no friction rub.  No murmur heard. Pulmonary/Chest: Effort normal and breath sounds normal. No respiratory distress. He exhibits no tenderness.  Abdominal: Soft. Normal appearance and bowel sounds are normal. There is no hepatosplenomegaly. There is no tenderness. There is no rebound, no guarding, no tenderness at McBurney's point and negative Murphy's sign. No hernia.  Musculoskeletal:       Left shoulder: He exhibits decreased range of motion and tenderness. He exhibits no deformity.  Neurological: He is alert and oriented to person, place, and time. He has normal strength. No cranial nerve deficit or sensory deficit. Coordination normal. GCS eye subscore is 4. GCS verbal subscore is 5. GCS motor subscore is 6.  Skin: Skin is warm, dry and intact. No rash noted. No cyanosis.  Psychiatric: He has a normal mood and affect. His speech  is normal and behavior is normal. Thought content normal.  Nursing note and vitals reviewed.    ED Treatments / Results  Labs (all labs ordered are listed, but only abnormal results are displayed) Labs Reviewed - No data to display  EKG  EKG Interpretation None       Radiology Dg Chest 2 View  Result Date: 12/04/2017 CLINICAL DATA:  Sore throat and left shoulder pain for couple of days. No trauma. EXAM: CHEST  2 VIEW COMPARISON:  11/17/2017 FINDINGS: Mild hyperinflation. The heart size and mediastinal contours are within normal limits. Both lungs are clear. The  visualized skeletal structures are unremarkable. IMPRESSION: No active cardiopulmonary disease. Electronically Signed   By: Burman Nieves M.D.   On: 12/04/2017 04:23   Dg Shoulder Left  Result Date: 12/04/2017 CLINICAL DATA:  Left shoulder pain for couple of days.  No trauma. EXAM: LEFT SHOULDER - 2+ VIEW COMPARISON:  None. FINDINGS: There is no evidence of fracture or dislocation. There is no evidence of arthropathy or other focal bone abnormality. Soft tissues are unremarkable. IMPRESSION: Negative. Electronically Signed   By: Burman Nieves M.D.   On: 12/04/2017 04:24    Procedures Procedures (including critical care time)  Medications Ordered in ED Medications - No data to display   Initial Impression / Assessment and Plan / ED Course  I have reviewed the triage vital signs and the nursing notes.  Pertinent labs & imaging results that were available during my care of the patient were reviewed by me and considered in my medical decision making (see chart for details).     Patient complaining of left shoulder pain with pain into the left chest area as well as up into the left side of his neck.  Reports that he feels short of breath.  Patient's lungs are clear.  He has normal 100% room air oxygen saturation without any evidence of respiratory distress.  There is no tachypnea or or tachycardia.  PERC negative.  Patient reports increased pain with raising his left arm.  He has full range of motion but it is painful to move the left shoulder and there is some crepitance in the joint with movement.  X-ray of the chest does not show any pneumothorax or other abnormality.  Left shoulder x-ray is also negative.  Patient reassured, no further workup necessary.  Treat with analgesia.  Final Clinical Impressions(s) / ED Diagnoses   Final diagnoses:  Acute pain of left shoulder    ED Discharge Orders        Ordered    etodolac (LODINE) 400 MG tablet  2 times daily PRN     12/04/17 0436         Gilda Crease, MD 12/04/17 [phone]

## 2017-12-04 NOTE — ED Notes (Signed)
AVS explained in detail. Knows to follow up with [facility] 12/07/2017 at 1015. Advised to alternate heat and ice therapy and to take prescribed medications. 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 Demonstrated proper lifting and moving techniques through teach back. Ambulatory with steady gait at time of discharge. No other c/c. Full range of motion noted with all extremities.

## 2017-12-04 NOTE — ED Notes (Signed)
Patient transported to X-ray 

## 2017-12-04 NOTE — ED Triage Notes (Signed)
States for a couple of days sore throat no drooling noted and left shoulder pain no trauma noted good csm noted.

## 2017-12-07 ENCOUNTER — Inpatient Hospital Stay: Payer: Self-pay | Admitting: Nurse Practitioner

## 2018-01-07 ENCOUNTER — Emergency Department (HOSPITAL_COMMUNITY): Payer: Self-pay

## 2018-01-07 ENCOUNTER — Encounter (HOSPITAL_COMMUNITY): Payer: Self-pay | Admitting: Emergency Medicine

## 2018-01-07 ENCOUNTER — Emergency Department (HOSPITAL_COMMUNITY)
Admission: EM | Admit: 2018-01-07 | Discharge: 2018-01-07 | Disposition: A | Discharge status: Home / Self Care | Payer: Self-pay | Attending: Emergency Medicine | Admitting: Emergency Medicine

## 2018-01-07 DIAGNOSIS — R0982 Postnasal drip: Secondary | ICD-10-CM | POA: Insufficient documentation

## 2018-01-07 DIAGNOSIS — J069 Acute upper respiratory infection, unspecified: Secondary | ICD-10-CM | POA: Insufficient documentation

## 2018-01-07 DIAGNOSIS — B9789 Other viral agents as the cause of diseases classified elsewhere: Secondary | ICD-10-CM | POA: Insufficient documentation

## 2018-01-07 DIAGNOSIS — F17210 Nicotine dependence, cigarettes, uncomplicated: Secondary | ICD-10-CM | POA: Insufficient documentation

## 2018-01-07 DIAGNOSIS — Z720 Tobacco use: Secondary | ICD-10-CM

## 2018-01-07 DIAGNOSIS — Z8614 Personal history of Methicillin resistant Staphylococcus aureus infection: Secondary | ICD-10-CM | POA: Insufficient documentation

## 2018-01-07 MED ORDER — CETIRIZINE HCL 10 MG PO TABS: 10.0000 mg | ORAL_TABLET | Freq: Every day | ORAL | 0 refills | Status: DC

## 2018-01-07 MED ORDER — DM-GUAIFENESIN ER 30-600 MG PO TB12: 1.0000 | ORAL_TABLET | Freq: Two times a day (BID) | ORAL | 0 refills | Status: AC

## 2018-01-07 MED ORDER — ALBUTEROL SULFATE HFA 108 (90 BASE) MCG/ACT IN AERS
1.0000 | INHALATION_SPRAY | Freq: Four times a day (QID) | RESPIRATORY_TRACT | Status: DC | PRN
  Administered 2018-01-07: 1 via RESPIRATORY_TRACT
  Filled 2018-01-07: qty 6.7

## 2018-01-07 MED ORDER — FLUTICASONE PROPIONATE 50 MCG/ACT NA SUSP: 1.0000 | Freq: Every day | NASAL | 2 refills | Status: DC

## 2018-01-07 NOTE — ED Provider Notes (Signed)
[facility]-EMERGENCY DEPT Provider Note   CSN: [id number] Arrival date & time: 01/07/18  1200     History   Chief Complaint Chief Complaint  Patient presents with  . Cough    HPI Craig Fitzgerald is a 42 y.o. male.  HPI   Patient is a 42 year old male with past medical history significant for nasal call in a sensation of MRSA presenting for dry "rattling cough" for a week, nasal congestion, rhinorrhea, and fever.  Patient reports last fever was 2 days ago and 101.  Patient has been taking ibuprofen and Tylenol alternating for fever.  Patient did not take any antipyretics prior to arrival.  Patient reports he has had multiple upper respiratory infections in the last couple months.  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  Patient reports that it is not flaring up at this time.  Patient takes mupirocin ointment for this.  Patient reports postnasal drip and a dry sore throat due to coughing.  Patient reports he has an inhaler at home but does not like to use it.  Patient smokes approximately half a pack a day.   Past Medical History:  Diagnosis Date  . Hernia, inguinal, left     There are no active problems to display for this patient.   History reviewed. No pertinent surgical history.     Home Medications    Prior to Admission medications   Medication Sig Start Date End Date Taking? Authorizing Provider  etodolac (LODINE) 400 MG tablet Take 1 tablet (400 mg total) by mouth 2 (two) times daily as needed for moderate pain. 12/04/17   Gilda CreasePollina, Christopher J, MD    Family History No family history on file.  Social History Social History   Tobacco Use  . Smoking status: Current Every Day Smoker    Types: Pipe  . Smokeless tobacco: Never Used  Substance Use Topics  . Alcohol use: Yes    Comment: Once a week.   . Drug use: Yes    Types: Marijuana    Comment: Last used: Week ago      Allergies   Patient has no known allergies.   Review of Systems Review of Systems  Constitutional: Positive for fever. Negative for chills.  HENT: Positive for congestion, rhinorrhea and sinus pressure. Negative for trouble swallowing and voice change.   Respiratory: Positive for cough and wheezing. Negative for shortness of breath.      Physical Exam Updated Vital Signs BP 134/70 (BP Location: Right Arm)   Pulse 81   Temp 98 F (36.7 C) (Oral)   Resp 17   SpO2 99%   Physical Exam  Constitutional: He appears well-developed and well-nourished. No distress.  Sitting comfortably in bed.  HENT:  Head: Normocephalic and atraumatic.  Cobblestoning of posterior pharynx. Inferior turbinate slightly erythematous.  No erythema of nares.  Eyes: Conjunctivae are normal. Right eye exhibits no discharge. Left eye exhibits no discharge.  EOMs normal to gross examination.  Neck: Normal range of motion.  Cardiovascular: Normal rate and regular rhythm.  Intact, 2+ radial pulse.  Pulmonary/Chest: He has wheezes.  Normal respiratory effort. Patient converses comfortably. No audible wheeze or stridor. Soft wheezes in right upper lung field.  No prolonged expiratory phase.  Abdominal: He exhibits no distension.  Musculoskeletal: Normal range of motion.  Neurological: He is alert.  Cranial nerves intact to gross observation. Patient moves extremities without difficulty.  Skin: Skin  is warm and dry. He is not diaphoretic.  Psychiatric: He has a normal mood and affect. His behavior is normal. Judgment and thought content normal.  Nursing note and vitals reviewed.    ED Treatments / Results  Labs (all labs ordered are listed, but only abnormal results are displayed) Labs Reviewed - No data to display  EKG  EKG Interpretation None       Radiology No results found.  Procedures Procedures (including critical care time)  Medications Ordered in ED Medications - No data to  display   Initial Impression / Assessment and Plan / ED Course  I have reviewed the triage vital signs and the nursing notes.  Pertinent labs & imaging results that were available during my care of the patient were reviewed by me and considered in my medical decision making (see chart for details).     Patient with symptoms consistent with a viral syndrome. Vitals are stable, no fever. No signs of dehydration. Lung exam normal, no signs of pneumonia.  Chest x-ray reviewed from prior.  Prior chest x-ray demonstrates mild hyperinflation.  No change in current chest x-ray.  Instructed patient that he needs to use his inhaler with spacer.  We will treat symptomatically as well for allergic rhinitis with Zyrtec, Flonase.  Patient recommended to take Mucinex.  Supportive therapy indicated with return if symptoms worsen.    Patient was counseled on smoking cessation, and reports that he has upcoming [facility].  I discussed with the patient discussing smoking cessation with his provider at this time.  Final Clinical Impressions(s) / ED Diagnoses   Final diagnoses:  Viral URI with cough    ED Discharge Orders        Ordered    [facility]an[facility]) 30-600 MG 12hr tablet  2 times daily     01/07/18 1322    fluticasone (FLONASE) 50 MCG/ACT nasal spray  Daily     01/07/18 1322    cetirizine (ZYRTEC) 10 MG tablet  Daily     01/07/18 1322       Elisha Ponder, PA-C 01/07/18 1324    Pricilla Loveless, MD 01/08/18 [phone]

## 2018-01-07 NOTE — ED Triage Notes (Signed)
Patient here from home with complaints of productive cough x2 weeks. Fever.

## 2018-01-07 NOTE — Discharge Instructions (Addendum)
Please read and follow all provided instructions.  Your diagnoses today include:  1. Viral URI with cough     You appear to have an upper respiratory infection (URI). An upper respiratory tract infection, or cold, is a viral infection of the air passages leading to the lungs. It should improve gradually after 5-7 days. You may have a lingering cough that lasts for 2- 4 weeks after the infection.  Tests performed today include: Vital signs. See below for your results today.   Medications prescribed:   Take any prescribed medications only as directed. Treatment for your infection is aimed at treating the symptoms. There are no medications, such as antibiotics, that will cure your infection.   Home care instructions:  Follow any educational materials contained in this packet.   Your illness is contagious and can be spread to others, especially during the first 3 or 4 days. It cannot be cured by antibiotics or other medicines. Take basic precautions such as washing your hands often, covering your mouth when you cough or sneeze, and avoiding public places where you could spread your illness to others.   Please continue drinking plenty of fluids.  Use over-the-counter medicines as needed as directed on packaging for symptom relief.  You may also use ibuprofen or tylenol as directed on packaging for pain or fever.  Do not take multiple medicines containing Tylenol or acetaminophen to avoid taking too much of this medication.  Follow-up instructions: Please follow-up with your primary care provider in the next 3 days for further evaluation of your symptoms if you are not feeling better.   Return instructions:  Please return to the Emergency Department if you experience worsening symptoms.  RETURN IMMEDIATELY IF you develop shortness of breath, chest pain, confusion or altered mental status, a new rash, become dizzy, faint, or poorly responsive, or are unable to be cared for at home. Please return  if you have persistent vomiting and cannot keep down fluids or develop a fever that is not controlled by tylenol or motrin.   Please return if you have any other emergent concerns.  Additional Information:  Your vital signs today were: BP 134/70 (BP Location: Right Arm)    Pulse 81    Temp 98 F (36.7 C) (Oral)    Resp 17    SpO2 99%  If your blood pressure (BP) was elevated above 135/85 this visit, please have this repeated by your doctor within one month. --------------

## 2018-01-07 NOTE — ED Notes (Signed)
Bed: WTR5 Expected date:  Expected time:  Means of arrival:  Comments: 

## 2018-03-10 ENCOUNTER — Encounter (HOSPITAL_COMMUNITY): Payer: Self-pay | Admitting: *Deleted

## 2018-03-10 ENCOUNTER — Emergency Department (HOSPITAL_COMMUNITY)
Admission: EM | Admit: 2018-03-10 | Discharge: 2018-03-10 | Disposition: A | Discharge status: Home / Self Care | Payer: Self-pay | Attending: Emergency Medicine | Admitting: Emergency Medicine

## 2018-03-10 DIAGNOSIS — Z5321 Procedure and treatment not carried out due to patient leaving prior to being seen by health care provider: Secondary | ICD-10-CM | POA: Insufficient documentation

## 2018-03-10 NOTE — ED Triage Notes (Signed)
Pt c/o left shoulder pain, was seen here in January but shoulder pain has not decreased.  Pt stated "I can't afford insurance and have not had a f/u."

## 2018-03-15 ENCOUNTER — Encounter: Payer: Self-pay | Admitting: Family Medicine

## 2018-03-15 ENCOUNTER — Ambulatory Visit: Payer: Self-pay | Attending: Family Medicine | Admitting: Family Medicine

## 2018-03-15 VITALS — BP 118/74 | HR 75 | Temp 98.4°F | Wt 152.0 lb

## 2018-03-15 DIAGNOSIS — M7542 Impingement syndrome of left shoulder: Secondary | ICD-10-CM | POA: Insufficient documentation

## 2018-03-15 MED ORDER — MELOXICAM 7.5 MG PO TABS: 7.5000 mg | ORAL_TABLET | Freq: Every day | ORAL | 1 refills | Status: DC

## 2018-03-15 MED ORDER — GABAPENTIN 300 MG PO CAPS: 300.0000 mg | ORAL_CAPSULE | Freq: Every day | ORAL | 1 refills | Status: DC

## 2018-03-15 MED ORDER — METHOCARBAMOL 500 MG PO TABS: 500.0000 mg | ORAL_TABLET | Freq: Three times a day (TID) | ORAL | 1 refills | Status: DC | PRN

## 2018-03-15 MED FILL — GABAPENTIN 300 MG CAPSULE: 300 | 30 days supply | Qty: 30 | Fill #0

## 2018-03-15 MED FILL — MELOXICAM 7.5 MG TABLET: 7.5 | 30 days supply | Qty: 30 | Fill #0

## 2018-03-15 MED FILL — METHOCARBAMOL 500 MG TABS: 500 | 30 days supply | Qty: 90 | Fill #0

## 2018-03-15 NOTE — Patient Instructions (Signed)
Shoulder Impingement Syndrome Shoulder impingement syndrome is a condition that causes pain when connective tissues (tendons) surrounding the shoulder joint become pinched. These tendons are part of the group of muscles and tissues that help to stabilize the shoulder (rotator cuff). Beneath the rotator cuff is a fluid-filled sac (bursa) that allows the muscles and tendons to glide smoothly. The bursa may become swollen or irritated (bursitis). Bursitis, swelling in the rotator cuff tendons, or both conditions can decrease how much space is under a bone in the shoulder joint (acromion), resulting in impingement. What are the causes? Shoulder impingement syndrome can be caused by bursitis or swelling of the rotator cuff tendons, which may result from:  Repetitive overhead arm movements.  Falling onto the shoulder.  Weakness in the shoulder muscles.  What increases the risk? You may be more likely to develop this condition if you are an athlete who participates in:  Sports that involve throwing, such as baseball.  Tennis.  Swimming.  Volleyball.  Some people are also more likely to develop impingement syndrome because of the shape of their acromion bone. What are the signs or symptoms? The main symptom of this condition is pain on the front or side of the shoulder. Pain may:  Get worse when lifting or raising the arm.  Get worse at night.  Wake you up from sleeping.  Feel sharp when the shoulder is moved, and then fade to an ache.  Other signs and symptoms may include:  Tenderness.  Stiffness.  Inability to raise the arm above shoulder level or behind the body.  Weakness.  How is this diagnosed? This condition may be diagnosed based on:  Your symptoms.  Your medical history.  A physical exam.  Imaging tests, such as: ? X-rays. ? MRI. ? Ultrasound.  How is this treated? Treatment for this condition may include:  Resting your shoulder and avoiding all  activities that cause pain or put stress on the shoulder.  Icing your shoulder.  NSAIDs to help reduce pain and swelling.  One or more injections of medicines to numb the area and reduce inflammation.  Physical therapy.  Surgery. This may be needed if nonsurgical treatments have not helped. Surgery may involve repairing the rotator cuff, reshaping the acromion, or removing the bursa.  Follow these instructions at home: Managing pain, stiffness, and swelling  If directed, apply ice to the injured area. ? Put ice in a plastic bag. ? Place a towel between your skin and the bag. ? Leave the ice on for 20 minutes, 2-3 times a day. Activity  Rest and return to your normal activities as told by your health care provider. Ask your health care provider what activities are safe for you.  Do exercises as told by your health care provider. General instructions  Do not use any tobacco products, including cigarettes, chewing tobacco, or e-cigarettes. Tobacco can delay healing. If you need help quitting, ask your health care provider.  Ask your health care provider when it is safe for you to drive.  Take over-the-counter and prescription medicines only as told by your health care provider.  Keep all follow-up visits as told by your health care provider. This is important. How is this prevented?  Give your body time to rest between periods of activity.  Be safe and responsible while being active to avoid falls.  Maintain physical fitness, including strength and flexibility. Contact a health care provider if:  Your symptoms have not improved after 1-2 months of treatment and   rest.  You cannot lift your arm away from your body. This information is not intended to replace advice given to you by your health care provider. Make sure you discuss any questions you have with your health care provider. Document Released: 10/25/2005 Document Revised: 07/01/2016 Document Reviewed:  09/27/2015 Elsevier Interactive Patient Education  2018 [employer]vier Inc.  

## 2018-03-15 NOTE — Progress Notes (Deleted)
Patient ID: Craig Fitzgerald, male   DOB: [birth year], 42 y.o.   MRN: [id number]

## 2018-03-15 NOTE — Progress Notes (Signed)
Subjective:  Patient ID: Craig Fitzgerald, male    DOB: [birth year]  Age: 42 y.o. MRN: [id number]  CC: Hospitalization Follow-up   HPI Craig Fitzgerald is a 42 year old right-handed male who presents today to establish care complaining of a 40-month history of left shoulder pain with acute exacerbation which commenced 6 days ago causing him to miss work on 03/09/2018. It appears he presented to the emergency room on 03/10/2018 and was seen by triage but there are no doctor's notes. In 11/2017 he had a left shoulder x-ray and chest x-ray which were unremarkable.  Today he informs me pain is at the 8/10 and worse in the anterior aspect of his left shoulder with associated numbness which radiates down his left arm.  He feels the left side of his neck looks different from the right and has difficulty reaching but is able to pick up things.  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 He currently does not take any medications for pain.  Past Medical History:  Diagnosis Date  . Hernia, inguinal, left     History reviewed. No pertinent surgical history.  No Known Allergies   Outpatient Medications Prior to Visit  Medication Sig Dispense Refill  . cetirizine (ZYRTEC) 10 MG tablet Take 1 tablet (10 mg total) by mouth daily. (Patient not taking: Reported on 03/10/2018) 30 tablet 0  . fluticasone (FLONASE) 50 MCG/ACT nasal spray Place 1 spray into both nostrils daily. (Patient not taking: Reported on 03/10/2018) 16 g 2  . etodolac (LODINE) 400 MG tablet Take 1 tablet (400 mg total) by mouth 2 (two) times daily as needed for moderate pain. (Patient not taking: Reported on 03/10/2018) 14 tablet 0   No facility-administered medications prior to visit.     ROS Review of Systems  Constitutional: Negative for activity change and appetite change.  HENT: Negative for sinus pressure and sore throat.   Eyes: Negative for  visual disturbance.  Respiratory: Negative for cough, chest tightness and shortness of breath.   Cardiovascular: Negative for chest pain and leg swelling.  Gastrointestinal: Negative for abdominal distention, abdominal pain, constipation and diarrhea.  Endocrine: Negative.   Genitourinary: Negative for dysuria.  Musculoskeletal:       See hpi  Skin: Negative for rash.  Allergic/Immunologic: Negative.   Neurological: Negative for weakness, light-headedness and numbness.  Psychiatric/Behavioral: Negative for dysphoric mood and suicidal ideas.    Objective:  BP 118/74   Pulse 75   Temp 98.4 F (36.9 C) (Oral)   Wt 152 lb (68.9 kg)   SpO2 98%   BMI 25.29 kg/m   BP/Weight 03/15/2018 03/10/2018 01/07/2018  Systolic BP 118 121 130  Diastolic BP 74 83 70  Wt. (Lbs) 152 165 -  BMI 25.29 27.46 -      Physical Exam  Constitutional: He is oriented to person, place, and time. He appears well-developed and well-nourished.  Cardiovascular: Normal rate, normal heart sounds and intact distal pulses.  No murmur heard. Pulmonary/Chest: Effort normal and breath sounds normal. He has no wheezes. He has no rales. He exhibits no tenderness.  Abdominal: Soft. Bowel sounds are normal. He exhibits no distension and no mass. There is no tenderness.  Musculoskeletal: Normal range of motion. He exhibits tenderness (TTP of L ant shoulder). He exhibits no edema.  Left clavicle more prominent than right Full active range of motion achievable when performed slowly in left upper extremity  Positive Hawkins sign on the left  Neurological: He is alert and oriented to person, place, and time.  Skin: Skin is warm and dry.  Psychiatric: He has a normal mood and affect.     Assessment & Plan:   1. Impingement syndrome of left shoulder Provided letter for his place of work indicating restricted duty Advised he would need to apply for the [employer] financial discount to facilitate a referral to PT -  methocarbamol (ROBAXIN) 500 MG tablet; Take 1 tablet (500 mg total) by mouth every 8 (eight) hours as needed for muscle spasms.  Dispense: 90 tablet; Refill: 1 - meloxicam (MOBIC) 7.5 MG tablet; Take 1 tablet (7.5 mg total) by mouth daily.  Dispense: 30 tablet; Refill: 1 - gabapentin (NEURONTIN) 300 MG capsule; Take 1 capsule (300 mg total) by mouth at bedtime.  Dispense: 30 capsule; Refill: 1 - Ambulatory referral to Physical Therapy   Meds ordered this encounter  Medications  . methocarbamol (ROBAXIN) 500 MG tablet    Sig: Take 1 tablet (500 mg total) by mouth every 8 (eight) hours as needed for muscle spasms.    Dispense:  90 tablet    Refill:  1  . meloxicam (MOBIC) 7.5 MG tablet    Sig: Take 1 tablet (7.5 mg total) by mouth daily.    Dispense:  30 tablet    Refill:  1  . gabapentin (NEURONTIN) 300 MG capsule    Sig: Take 1 capsule (300 mg total) by mouth at bedtime.    Dispense:  30 capsule    Refill:  1    Follow-up: Return in about 2 months (around 05/15/2018) for Follow-up of left shoulder impingement.   Hoy Register MD

## 2018-04-11 ENCOUNTER — Encounter (HOSPITAL_COMMUNITY): Payer: Self-pay | Admitting: Emergency Medicine

## 2018-04-11 ENCOUNTER — Emergency Department (HOSPITAL_COMMUNITY)
Admission: EM | Admit: 2018-04-11 | Discharge: 2018-04-11 | Disposition: A | Discharge status: Home / Self Care | Payer: Self-pay | Attending: Emergency Medicine | Admitting: Emergency Medicine

## 2018-04-11 ENCOUNTER — Other Ambulatory Visit: Payer: Self-pay

## 2018-04-11 DIAGNOSIS — F17290 Nicotine dependence, other tobacco product, uncomplicated: Secondary | ICD-10-CM | POA: Insufficient documentation

## 2018-04-11 DIAGNOSIS — Z79899 Other long term (current) drug therapy: Secondary | ICD-10-CM | POA: Insufficient documentation

## 2018-04-11 NOTE — ED Provider Notes (Signed)
[facility]-EMERGENCY DEPT Provider Note   CSN: [id number] Arrival date & time: 04/11/18  0046     History   Chief Complaint Chief Complaint  Patient presents with  . Medication Refill    HPI Craig Fitzgerald is a 42 y.o. male.  HPI 42 year old male presents to the ED for evaluation of medication management.  Patient states that he is currently taking Mobic, Robaxin and gabapentin for a pinched nerve.  The patient states that he works third shift and he takes his medications when he gets off in the morning.  The patient states that he is having uninterrupted sleep and needs to be able to wake up for his job.  Patient believes that the gabapentin is causing him to sleep very soundly and not wake up while he is sleeping.  He try to talk to his primary care doctor who was not able to help him with his problems.  Patient presents to the ED for evaluation.  Patient states that he is getting "too much sleep and needs to be able to wake up.  Patient reports having nausea intermittently that he associates to the gabapentin.  Patient is wondering if he can stop taking the gabapentin and possibly take some other pain medication.  He is taking the gabapentin  is "for his inflammation".  Patient denies associated headache, vision changes, lightheadedness, dizziness, chest pain, shortness of breath, abdominal pain, urinary symptoms, change in bowel habits. Past Medical History:  Diagnosis Date  . Hernia, inguinal, left     There are no active problems to display for this patient.   History reviewed. No pertinent surgical history.      Home Medications    Prior to Admission medications   Medication Sig Start Date End Date Taking? Authorizing Provider  cetirizine (ZYRTEC) 10 MG tablet Take 1 tablet (10 mg total) by mouth daily. Patient not taking: Reported on 03/10/2018 01/07/18 02/06/18  Aviva KluverMurray, Alyssa B, PA-C  fluticasone (FLONASE) 50 MCG/ACT nasal spray Place 1 spray into both  nostrils daily. Patient not taking: Reported on 03/10/2018 01/07/18   Aviva KluverMurray, Alyssa B, PA-C  gabapentin (NEURONTIN) 300 MG capsule Take 1 capsule (300 mg total) by mouth at bedtime. 03/15/18   Hoy RegisterNewlin, Enobong, MD  meloxicam (MOBIC) 7.5 MG tablet Take 1 tablet (7.5 mg total) by mouth daily. 03/15/18   Hoy RegisterNewlin, Enobong, MD  methocarbamol (ROBAXIN) 500 MG tablet Take 1 tablet (500 mg total) by mouth every 8 (eight) hours as needed for muscle spasms. 03/15/18   Hoy RegisterNewlin, Enobong, MD    Family History History reviewed. No pertinent family history.  Social History Social History   Tobacco Use  . Smoking status: Current Every Day Smoker    Types: Pipe  . Smokeless tobacco: Never Used  Substance Use Topics  . Alcohol use: Yes    Comment: Once a week.   . Drug use: Yes    Types: Marijuana    Comment: Last used: Week ago     Allergies   Patient has no known allergies.   Review of Systems Review of Systems  All other systems reviewed and are negative.    Physical Exam Updated Vital Signs BP 120/78 (BP Location: Right Arm)   Pulse 78   Temp 98.1 F (36.7 C) (Oral)   Resp 18   Ht 5\' 5"  (1.651 m)   Wt 70.3 kg (155 lb)   SpO2 100%   BMI 25.79 kg/m   Physical Exam  Constitutional: He is oriented  to person, place, and time. He appears well-developed and well-nourished. No distress.  HENT:  Head: Normocephalic and atraumatic.  Eyes: Right eye exhibits no discharge. Left eye exhibits no discharge. No scleral icterus.  Neck: Normal range of motion. Neck supple.  Pulmonary/Chest: Effort normal and breath sounds normal. No stridor. No respiratory distress. He has no wheezes. He has no rales. He exhibits no tenderness.  Abdominal: Soft. Bowel sounds are normal. He exhibits no distension. There is no tenderness. There is no rebound and no guarding.  Musculoskeletal: Normal range of motion.  Neurological: He is alert and oriented to person, place, and time.  Skin: Skin is warm and dry.  Capillary refill takes less than 2 seconds. No pallor.  Psychiatric: His behavior is normal. Judgment and thought content normal.  Nursing note and vitals reviewed.    ED Treatments / Results  Labs (all labs ordered are listed, but only abnormal results are displayed) Labs Reviewed - No data to display  EKG None  Radiology No results found.  Procedures Procedures (including critical care time)  Medications Ordered in ED Medications - No data to display   Initial Impression / Assessment and Plan / ED Course  I have reviewed the triage vital signs and the nursing notes.  Pertinent labs & imaging results that were available during my care of the patient were reviewed by me and considered in my medical decision making (see chart for details).     Patient presents to the ED requesting management of this medication.  Patient believes that the gabapentin is making him sleep more and not able to wake up for his work.  Patient states that he is taking the gabapentin and Robaxin prior to going to bed.  I discussed with patient that the gabapentin is likely not causing him to sleep more.  I suspect that the Robaxin is causing her to be more drowsy.  I encouraged him to stop taking the Robaxin to see if this helps his symptoms.  He is [name] taken anti-inflammatory which is the Mobic.  Patient at the end of my examination was requesting a 2-day work note.  Discussed with patient that I can only give him today office due to being in the ED.  Patient needs a follow-up with his primary care doctor.  Exam is reassuring.  Lab work is reassuring.  Patient instructed return with any worsening symptoms.  Pt is hemodynamically stable, in NAD, & able to ambulate in the ED. Evaluation does not show pathology that would require ongoing emergent intervention or inpatient treatment. I explained the diagnosis to the patient. Pain has been managed & has no complaints prior to dc. Pt is comfortable with above  plan and is stable for discharge at this time. All questions were answered prior to disposition. Strict return precautions for f/u to the ED were discussed. Encouraged follow up with PCP.   Final Clinical Impressions(s) / ED Diagnoses   Final diagnoses:  Medication management    ED Discharge Orders    None       Wallace Keller 04/11/18 Terrall Laity, MD 04/11/18 [phone]

## 2018-04-11 NOTE — Discharge Instructions (Addendum)
I suspect that the Robaxin is was making you sleepy.  Would avoid taking this medication.  Continue taking other medications.  Follow-up with primary care doctor.

## 2018-04-11 NOTE — ED Triage Notes (Signed)
Pt reports needing refill on Gabapentin.

## 2018-04-19 ENCOUNTER — Encounter: Payer: Self-pay | Admitting: Family Medicine

## 2018-04-19 ENCOUNTER — Ambulatory Visit: Payer: Self-pay | Attending: Family Medicine | Admitting: Family Medicine

## 2018-04-19 DIAGNOSIS — Z79899 Other long term (current) drug therapy: Secondary | ICD-10-CM | POA: Insufficient documentation

## 2018-04-19 DIAGNOSIS — M7542 Impingement syndrome of left shoulder: Secondary | ICD-10-CM | POA: Insufficient documentation

## 2018-04-19 DIAGNOSIS — Z791 Long term (current) use of non-steroidal anti-inflammatories (NSAID): Secondary | ICD-10-CM | POA: Insufficient documentation

## 2018-04-19 MED ORDER — MELOXICAM 7.5 MG PO TABS: 7.5000 mg | ORAL_TABLET | Freq: Every day | ORAL | 1 refills | Status: DC

## 2018-04-19 MED ORDER — GABAPENTIN 100 MG PO CAPS: 100.0000 mg | ORAL_CAPSULE | Freq: Every day | ORAL | 1 refills | Status: DC

## 2018-04-19 MED ORDER — METHOCARBAMOL 500 MG PO TABS: 500.0000 mg | ORAL_TABLET | Freq: Three times a day (TID) | ORAL | 1 refills | Status: DC | PRN

## 2018-04-19 NOTE — Progress Notes (Signed)
Subjective:  Patient ID: Craig Fitzgerald, male    DOB: [birth year]  Age: 42 y.o. MRN: [id number]  CC: Shoulder Pain   HPI Eisen Leda GauzeM Dudek is a 42 year old right-handed male who presents today for a follow up of left shoulder impingement which he has had for the last 4-5 months and symptoms have been intermittent. I had referred him to PT at his last visit which he is yet to undergo and placed him on Gabapentin, Robaxin and Mobic. He complains of having interruptions in his sleep by people waking him up in his apartment and then finds himself throwing up subsequently and this is new for him; he thinks it is related to his medications. On inquiring if he is more sedated with his medications he is unable to give me a direct answer. He had an ED visit where he had complained of his meds and was told to discontinue them. Also informed me he "reinjured" his arm recently at work while working on the [employer] line. He is requesting a note to return to work tomorrow.  Past Medical History:  Diagnosis Date  . Hernia, inguinal, left     History reviewed. No pertinent surgical history.  No Known Allergies   Outpatient Medications Prior to Visit  Medication Sig Dispense Refill  . methocarbamol (ROBAXIN) 500 MG tablet Take 1 tablet (500 mg total) by mouth every 8 (eight) hours as needed for muscle spasms. 90 tablet 1  . cetirizine (ZYRTEC) 10 MG tablet Take 1 tablet (10 mg total) by mouth daily. (Patient not taking: Reported on 03/10/2018) 30 tablet 0  . fluticasone (FLONASE) 50 MCG/ACT nasal spray Place 1 spray into both nostrils daily. (Patient not taking: Reported on 03/10/2018) 16 g 2  . gabapentin (NEURONTIN) 300 MG capsule Take 1 capsule (300 mg total) by mouth at bedtime. (Patient not taking: Reported on 04/19/2018) 30 capsule 1  . meloxicam (MOBIC) 7.5 MG tablet Take 1 tablet (7.5 mg total) by mouth daily. (Patient not taking: Reported on 04/19/2018) 30 tablet 1   No facility-administered  medications prior to visit.     ROS Review of Systems  Constitutional: Negative for activity change and appetite change.  HENT: Negative for sinus pressure and sore throat.   Eyes: Negative for visual disturbance.  Respiratory: Negative for cough, chest tightness and shortness of breath.   Cardiovascular: Negative for chest pain and leg swelling.  Gastrointestinal: Negative for abdominal distention, abdominal pain, constipation and diarrhea.  Endocrine: Negative.   Genitourinary: Negative for dysuria.  Musculoskeletal:       See hpi  Skin: Negative for rash.  Allergic/Immunologic: Negative.   Neurological: Negative for weakness, light-headedness and numbness.  Psychiatric/Behavioral: Negative for dysphoric mood and suicidal ideas.    Objective:  BP 120/71   Pulse 66   Temp 97.9 F (36.6 C) (Oral)   Wt 160 lb 9.6 oz (72.8 kg)   SpO2 100%   BMI 26.73 kg/m   BP/Weight 04/19/2018 04/11/2018 03/15/2018  Systolic BP 120 120 118  Diastolic BP 71 78 74  Wt. (Lbs) 160.6 155 152  BMI 26.73 25.79 25.29      Physical Exam  Constitutional: He is oriented to person, place, and time. He appears well-developed and well-nourished.  Cardiovascular: Normal rate, normal heart sounds and intact distal pulses.  No murmur heard. Pulmonary/Chest: Effort normal and breath sounds normal. He has no wheezes. He has no rales. He exhibits no tenderness.  Abdominal: Soft. Bowel sounds are normal. He exhibits no  distension and no mass. There is no tenderness.  Musculoskeletal:  TTP of anterior aspect of L shoulder; positive Hawkin's sign. Passive FROM attainable with slow movements but only able to hold LUE for a short while  Neurological: He is alert and oriented to person, place, and time.  Skin: Skin is warm and dry.  Psychiatric: He has a normal mood and affect.     Assessment & Plan:   1. Impingement syndrome of left shoulder Reduced dose of Gabapentin and advised to use at bedtime He will  benefit from PT however he will need to apply for [facility] Financial assistance given he has no medical coverage Provided letter to return to work tomorrow - meloxicam (MOBIC) 7.5 MG tablet; Take 1 tablet (7.5 mg total) by mouth daily.  Dispense: 30 tablet; Refill: 1 - gabapentin (NEURONTIN) 100 MG capsule; Take 1 capsule (100 mg total) by mouth at bedtime.  Dispense: 30 capsule; Refill: 1 - methocarbamol (ROBAXIN) 500 MG tablet; Take 1 tablet (500 mg total) by mouth every 8 (eight) hours as needed for muscle spasms.  Dispense: 90 tablet; Refill: 1   Meds ordered this encounter  Medications  . meloxicam (MOBIC) 7.5 MG tablet    Sig: Take 1 tablet (7.5 mg total) by mouth daily.    Dispense:  30 tablet    Refill:  1  . gabapentin (NEURONTIN) 100 MG capsule    Sig: Take 1 capsule (100 mg total) by mouth at bedtime.    Dispense:  30 capsule    Refill:  1    Discontinue previous dose  . methocarbamol (ROBAXIN) 500 MG tablet    Sig: Take 1 tablet (500 mg total) by mouth every 8 (eight) hours as needed for muscle spasms.    Dispense:  90 tablet    Refill:  1    Follow-up: Return for Follow-up of left shoulder pain, keep previously scheduled appointment.   Hoy Register MD

## 2018-04-19 NOTE — Patient Instructions (Signed)
Shoulder Impingement Syndrome Shoulder impingement syndrome is a condition that causes pain when connective tissues (tendons) surrounding the shoulder joint become pinched. These tendons are part of the group of muscles and tissues that help to stabilize the shoulder (rotator cuff). Beneath the rotator cuff is a fluid-filled sac (bursa) that allows the muscles and tendons to glide smoothly. The bursa may become swollen or irritated (bursitis). Bursitis, swelling in the rotator cuff tendons, or both conditions can decrease how much space is under a bone in the shoulder joint (acromion), resulting in impingement. What are the causes? Shoulder impingement syndrome can be caused by bursitis or swelling of the rotator cuff tendons, which may result from:  Repetitive overhead arm movements.  Falling onto the shoulder.  Weakness in the shoulder muscles.  What increases the risk? You may be more likely to develop this condition if you are an athlete who participates in:  Sports that involve throwing, such as baseball.  Tennis.  Swimming.  Volleyball.  Some people are also more likely to develop impingement syndrome because of the shape of their acromion bone. What are the signs or symptoms? The main symptom of this condition is pain on the front or side of the shoulder. Pain may:  Get worse when lifting or raising the arm.  Get worse at night.  Wake you up from sleeping.  Feel sharp when the shoulder is moved, and then fade to an ache.  Other signs and symptoms may include:  Tenderness.  Stiffness.  Inability to raise the arm above shoulder level or behind the body.  Weakness.  How is this diagnosed? This condition may be diagnosed based on:  Your symptoms.  Your medical history.  A physical exam.  Imaging tests, such as: ? X-rays. ? MRI. ? Ultrasound.  How is this treated? Treatment for this condition may include:  Resting your shoulder and avoiding all  activities that cause pain or put stress on the shoulder.  Icing your shoulder.  NSAIDs to help reduce pain and swelling.  One or more injections of medicines to numb the area and reduce inflammation.  Physical therapy.  Surgery. This may be needed if nonsurgical treatments have not helped. Surgery may involve repairing the rotator cuff, reshaping the acromion, or removing the bursa.  Follow these instructions at home: Managing pain, stiffness, and swelling  If directed, apply ice to the injured area. ? Put ice in a plastic bag. ? Place a towel between your skin and the bag. ? Leave the ice on for 20 minutes, 2-3 times a day. Activity  Rest and return to your normal activities as told by your health care provider. Ask your health care provider what activities are safe for you.  Do exercises as told by your health care provider. General instructions  Do not use any tobacco products, including cigarettes, chewing tobacco, or e-cigarettes. Tobacco can delay healing. If you need help quitting, ask your health care provider.  Ask your health care provider when it is safe for you to drive.  Take over-the-counter and prescription medicines only as told by your health care provider.  Keep all follow-up visits as told by your health care provider. This is important. How is this prevented?  Give your body time to rest between periods of activity.  Be safe and responsible while being active to avoid falls.  Maintain physical fitness, including strength and flexibility. Contact a health care provider if:  Your symptoms have not improved after 1-2 months of treatment and   rest.  You cannot lift your arm away from your body. This information is not intended to replace advice given to you by your health care provider. Make sure you discuss any questions you have with your health care provider. Document Released: 10/25/2005 Document Revised: 07/01/2016 Document Reviewed:  09/27/2015 Elsevier Interactive Patient Education  2018 [employer]vier Inc.  

## 2018-07-04 IMAGING — CR DG SHOULDER 2+V*L*
3 series · 3 of 3 positions shown · non-contrast
Comparison: None.

CLINICAL DATA: Left shoulder pain for couple of days.  No trauma.

EXAM:
LEFT SHOULDER - 2+ VIEW

[w shoulder internal left]
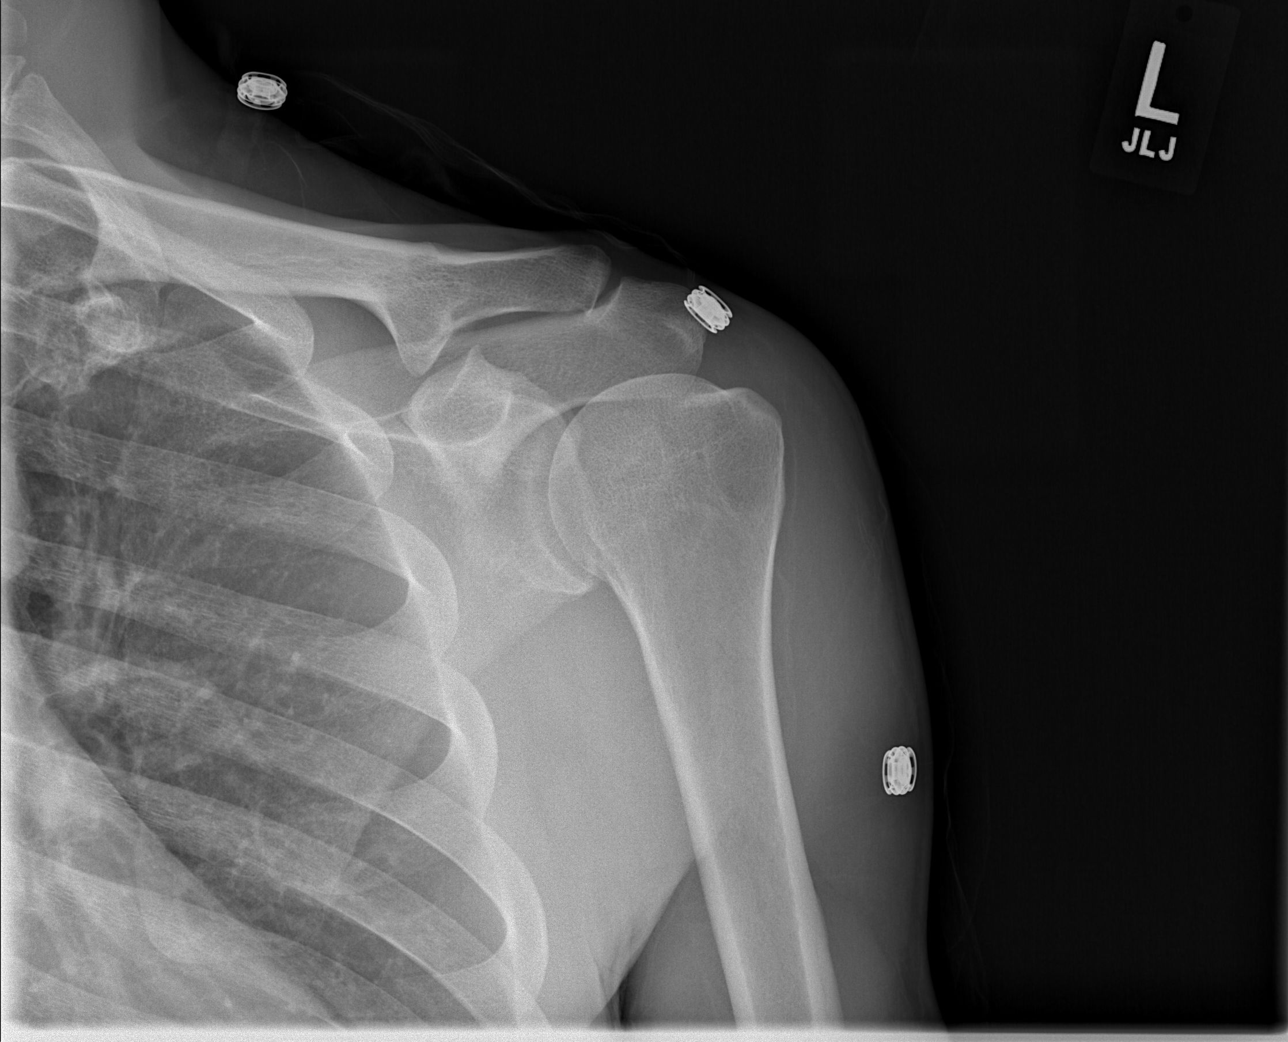

[w shoulder y-view left]
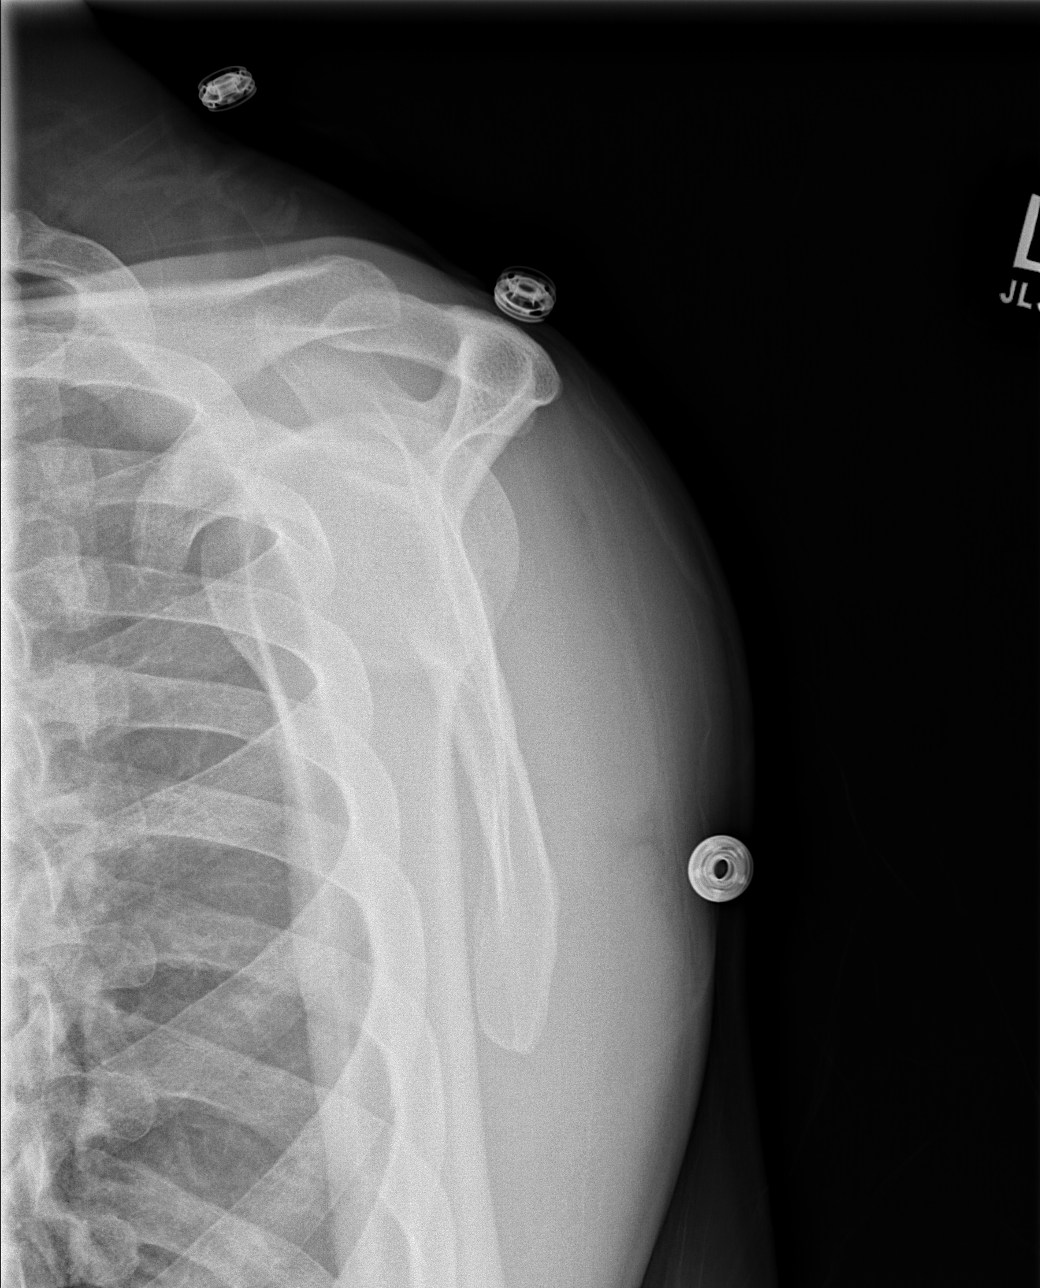

[x shoulder axillary left]
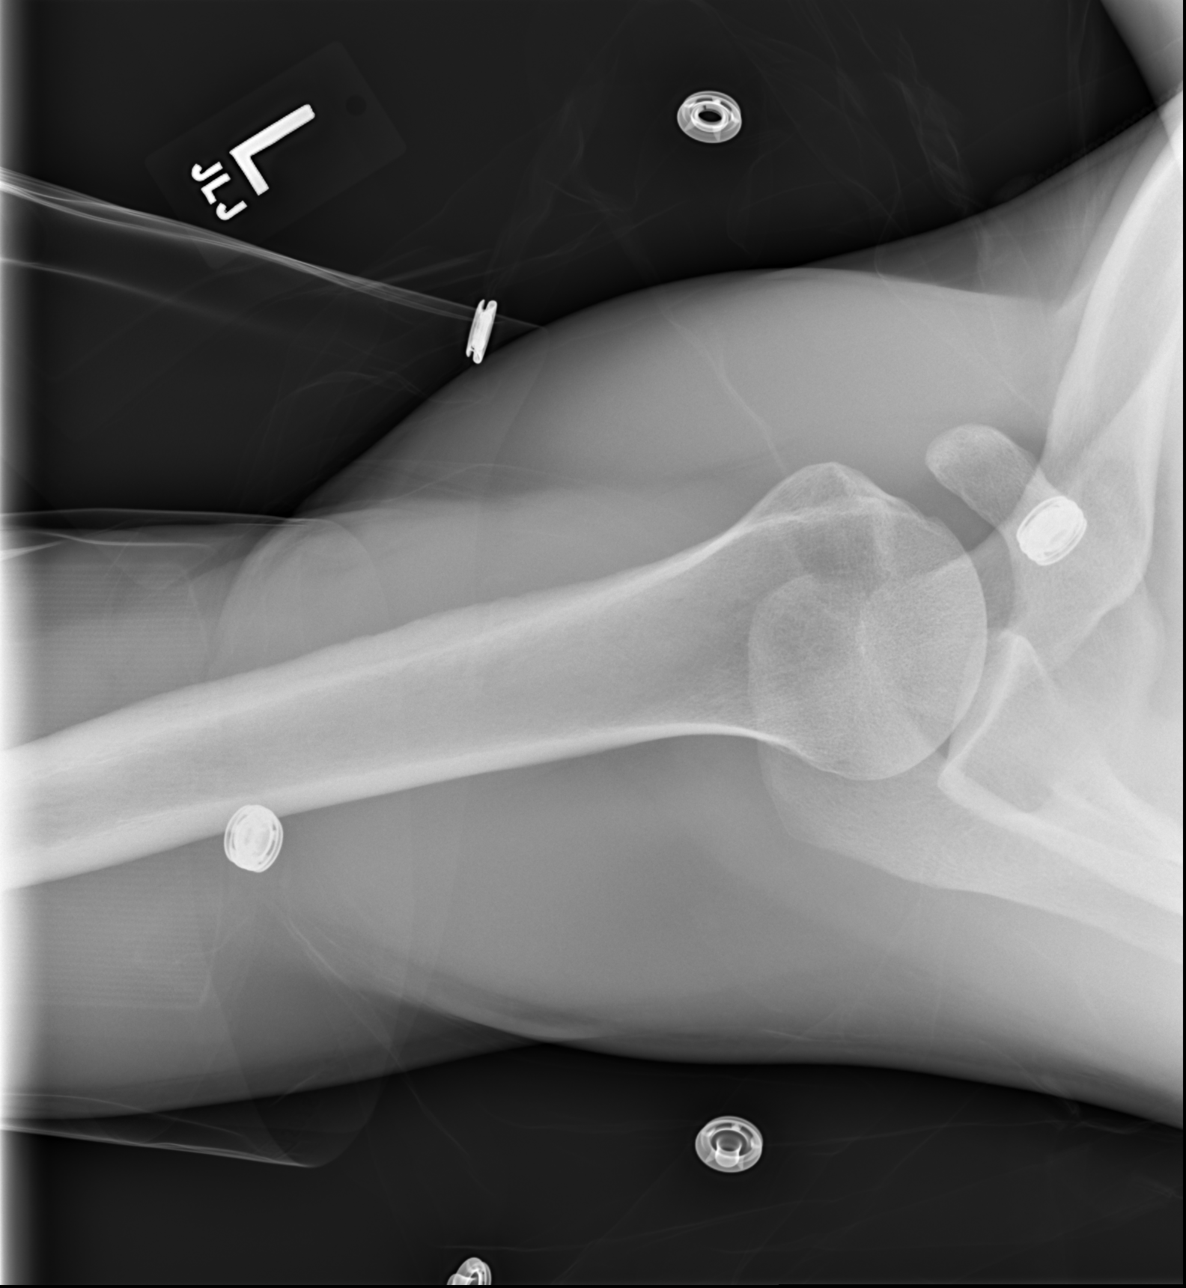

[3 of 3 positions shown; findings below may reference images not displayed]

FINDINGS: There is no evidence of fracture or dislocation. There is no
evidence of arthropathy or other focal bone abnormality. Soft
tissues are unremarkable.
IMPRESSION: Negative.

## 2021-05-01 ENCOUNTER — Ambulatory Visit (INDEPENDENT_AMBULATORY_CARE_PROVIDER_SITE_OTHER): Payer: Self-pay | Admitting: Primary Care

## 2021-05-01 ENCOUNTER — Other Ambulatory Visit: Payer: Self-pay

## 2021-05-01 ENCOUNTER — Ambulatory Visit (INDEPENDENT_AMBULATORY_CARE_PROVIDER_SITE_OTHER): Payer: Medicaid Other | Admitting: Primary Care

## 2021-05-01 ENCOUNTER — Encounter (INDEPENDENT_AMBULATORY_CARE_PROVIDER_SITE_OTHER): Payer: Self-pay | Admitting: Primary Care

## 2021-05-01 VITALS — BP 125/88 | HR 76 | Temp 98.6°F | Resp 18 | Ht 65.81 in | Wt 149.0 lb

## 2021-05-01 DIAGNOSIS — F17200 Nicotine dependence, unspecified, uncomplicated: Secondary | ICD-10-CM

## 2021-05-01 DIAGNOSIS — M7542 Impingement syndrome of left shoulder: Secondary | ICD-10-CM

## 2021-05-01 DIAGNOSIS — Z716 Tobacco abuse counseling: Secondary | ICD-10-CM

## 2021-05-01 DIAGNOSIS — F419 Anxiety disorder, unspecified: Secondary | ICD-10-CM

## 2021-05-01 DIAGNOSIS — F32A Depression, unspecified: Secondary | ICD-10-CM

## 2021-05-01 DIAGNOSIS — Z2821 Immunization not carried out because of patient refusal: Secondary | ICD-10-CM

## 2021-05-01 DIAGNOSIS — Z0000 Encounter for general adult medical examination without abnormal findings: Secondary | ICD-10-CM

## 2021-05-01 DIAGNOSIS — J9801 Acute bronchospasm: Secondary | ICD-10-CM

## 2021-05-01 MED ORDER — ALBUTEROL SULFATE HFA 108 (90 BASE) MCG/ACT IN AERS
2.0000 | INHALATION_SPRAY | Freq: Four times a day (QID) | RESPIRATORY_TRACT | 6 refills | Status: AC | PRN
  Filled 2021-05-01: qty 18, 25d supply, fill #0

## 2021-05-01 MED ORDER — METHOCARBAMOL 500 MG PO TABS
500.0000 mg | ORAL_TABLET | Freq: Three times a day (TID) | ORAL | 1 refills | Status: AC | PRN
  Filled 2021-05-01: qty 90, 30d supply, fill #0

## 2021-05-01 NOTE — Progress Notes (Signed)
New Patient Office Visit  Subjective:  Patient ID: [id number] Craig CIVELLO, male    DOB: 1976/02/16  Age: 45 y.o. MRN: [id number]  CC: establish care    HPI Craig Fitzgerald presents for establishment of care. He voices several concerns first is left shoulder impingement syndrome-this has been a problem for the last 3 years and unable to get assistance due to medical insurance and has COPD and recent weight loss.  He is in constant pain 10 out of 10 aggravating factors is lifting, raising his arm 50 degrees and difficulty bending the elbow.  States the injury caused by repetitive motion while at work.  He was employed at a time working on an [employer], television/film set.  Past Medical History:  Diagnosis Date   Hernia, inguinal, left     History reviewed. No pertinent surgical history.  Father - stroke 49   Social History   Socioeconomic History   Marital status: Single    Spouse name: Not on file   Number of children: Not on file   Years of education: Not on file   Highest education level: Not on file  Occupational History   Not on file  Tobacco Use   Smoking status: Every Day    Pack years: 0.00    Types: Pipe   Smokeless tobacco: Never  Vaping Use   Vaping Use: Never used  Substance and Sexual Activity   Alcohol use: Yes    Comment: Once a week.    Drug use: Yes    Types: Marijuana    Comment: Last used: Week ago   Sexual activity: Not on file  Other Topics Concern   Not on file  Social History Narrative   Not on file   Social Determinants of Health   Financial Resource Strain: Not on file  Food Insecurity: Not on file  Transportation Needs: Not on file  Physical Activity: Not on file  Stress: Not on file  Social Connections: Not on file  Intimate Partner Violence: Not on file    ROS Review of Systems  Constitutional:  Positive for appetite change and unexpected weight change.       Weight loss per patient 5 lbs in a year   Respiratory:  Positive for chest tightness,  shortness of breath and wheezing.        From asthma  Musculoskeletal:        Left arm decrease ROM  Psychiatric/Behavioral:  Positive for agitation and sleep disturbance.   All other systems reviewed and are negative.  Objective:   Today's Vitals: BP 125/88 (BP Location: Left Arm, Patient Position: Sitting, Cuff Size: Normal)   Pulse 76   Temp 98.6 F (37 C) (Oral)   Resp 18   Ht 5' 5.81" (1.672 m)   Wt 149 lb (67.6 kg)   SpO2 99%   BMI 24.19 kg/m   Physical Exam Vitals reviewed.  Constitutional:      Appearance: Normal appearance. He is normal weight.  HENT:     Head: Normocephalic.     Right Ear: Tympanic membrane and external ear normal.     Left Ear: Tympanic membrane and external ear normal.     Nose: Nose normal.  Eyes:     Extraocular Movements: Extraocular movements intact.     Pupils: Pupils are equal, round, and reactive to light.  Cardiovascular:     Rate and Rhythm: Normal rate and regular rhythm.  Pulmonary:     Effort: Pulmonary effort is normal.  Breath sounds: Normal breath sounds.  Abdominal:     General: Abdomen is flat. Bowel sounds are normal.  Musculoskeletal:     Cervical back: Normal range of motion and neck supple.     Comments: Decrease ROM left arm crepitus felt with ROM  Skin:    General: Skin is warm and dry.  Neurological:     Mental Status: He is alert and oriented to person, place, and time.  Psychiatric:        Mood and Affect: Mood normal.        Behavior: Behavior normal.   Assessment & Plan:   Problem List Items Addressed This Visit   None Visit Diagnoses     Encounter for medical examination to establish care    -  Primary   Relevant Orders   CBC with Differential   CMP14+EGFR   Impingement syndrome of left shoulder       Relevant Medications   methocarbamol (ROBAXIN) 500 MG tablet   Anxiety and depression       Tobacco abuse counseling       Bronchospasm       Relevant Medications   albuterol (VENTOLIN HFA)  108 (90 Base) MCG/ACT inhaler   Vaccination not carried out because of patient refusal           Outpatient Encounter Medications as of 05/01/2021  Medication Sig   albuterol (VENTOLIN HFA) 108 (90 Base) MCG/ACT inhaler Inhale 2 puffs into the lungs every 6 (six) hours as needed for wheezing or shortness of breath.   methocarbamol (ROBAXIN) 500 MG tablet Take 1 tablet (500 mg total) by mouth every 8 (eight) hours as needed for muscle spasms.   [DISCONTINUED] cetirizine (ZYRTEC) 10 MG tablet Take 1 tablet (10 mg total) by mouth daily. (Patient not taking: Reported on 03/10/2018)   [DISCONTINUED] fluticasone (FLONASE) 50 MCG/ACT nasal spray Place 1 spray into both nostrils daily. (Patient not taking: Reported on 03/10/2018)   [DISCONTINUED] gabapentin (NEURONTIN) 100 MG capsule Take 1 capsule (100 mg total) by mouth at bedtime.   [DISCONTINUED] meloxicam (MOBIC) 7.5 MG tablet Take 1 tablet (7.5 mg total) by mouth daily.   [DISCONTINUED] methocarbamol (ROBAXIN) 500 MG tablet Take 1 tablet (500 mg total) by mouth every 8 (eight) hours as needed for muscle spasms.   No facility-administered encounter medications on file as of 05/01/2021.  Diagnoses and all orders for this visit:  Encounter for medical examination to establish care -     CBC with Differential -     CMP14+EGFR  Impingement syndrome of left shoulder FINDINGS: 12/04/17 There is no evidence of fracture or dislocation. There is no evidence of arthropathy or other focal bone abnormality. Soft tissues are unremarkable. IMPRESSION: Negative -     methocarbamol (ROBAXIN) 500 MG tablet; Take 1 tablet (500 mg total) by mouth every 8 (eight) hours as needed for muscle spasms.  Anxiety and depression Flowsheet Row Office Visit from 05/01/2021 in [place]  PHQ-9 Total Score 15       GAD 7 : Generalized Anxiety Score 05/01/2021 04/19/2018 12/16/2016  Nervous, Anxious, on Edge _0 Control/stop worrying 1 0 0   Worry too much - different things 3 0 0  Trouble relaxing _1 Restless 1 0 0  Easily annoyed or irritable _2 Afraid - awful might happen 2 1 0  Total GAD 7 Score _3 Anxiety Difficulty Somewhat difficult - -  Refer to CSW- situational that has extended length of time , lost job, no insurance , pain in shoulder.     Tobacco abuse counseling - I have recommended complete cessation of tobacco use. I have discussed various options available for assistance with tobacco cessation including over the counter methods (Nicotine gum, patch and lozenges). We also discussed prescription options (Chantix, Nicotine Inhaler / Nasal Spray). The patient is not interested in pursuing any prescription tobacco cessation options at this time. - Patient declines at this time.     Bronchospasm -     albuterol (VENTOLIN HFA) 108 (90 Base) MCG/ACT inhaler; Inhale 2 puffs into the lungs every 6 (six) hours as needed for wheezing or shortness of breath.  Vaccination not carried out because of patient refusal    Follow-up: Return if symptoms worsen or fail to improve, for Needs appt with Clifton James and [employer] paper work. CSW appt when available .   Kerin Perna, NP

## 2021-05-01 NOTE — Progress Notes (Signed)
  [facility]   Subjective:   Mr. Craig Fitzgerald is a 45 y.o. male presents for hospital follow up and establish care. Seen in the ED on was 04/11/18, patient was discharged with dx   Past Medical History:  Diagnosis Date   Hernia, inguinal, left      No Known Allergies    Current Outpatient Medications on File Prior to Visit  Medication Sig Dispense Refill   cetirizine (ZYRTEC) 10 MG tablet Take 1 tablet (10 mg total) by mouth daily. (Patient not taking: Reported on 03/10/2018) 30 tablet 0   fluticasone (FLONASE) 50 MCG/ACT nasal spray Place 1 spray into both nostrils daily. (Patient not taking: Reported on 03/10/2018) 16 g 2   gabapentin (NEURONTIN) 100 MG capsule Take 1 capsule (100 mg total) by mouth at bedtime. 30 capsule 1   meloxicam (MOBIC) 7.5 MG tablet Take 1 tablet (7.5 mg total) by mouth daily. 30 tablet 1   methocarbamol (ROBAXIN) 500 MG tablet Take 1 tablet (500 mg total) by mouth every 8 (eight) hours as needed for muscle spasms. 90 tablet 1   No current facility-administered medications on file prior to visit.     Review of System: ROS  Objective:  There were no vitals taken for this visit.  There were no vitals filed for this visit.  Physical Exam:   General Appearance: Well nourished, in no apparent distress. Eyes: PERRLA, EOMs, conjunctiva no swelling or erythema Sinuses: No Frontal/maxillary tenderness ENT/Mouth: Ext aud canals clear, TMs without erythema, bulging. No erythema, swelling, or exudate on post pharynx.  Tonsils not swollen or erythematous. Hearing normal.  Neck: Supple, thyroid normal.  Respiratory: Respiratory effort normal, BS equal bilaterally without rales, rhonchi, wheezing or stridor.  Cardio: RRR with no MRGs. Brisk peripheral pulses without edema.  Abdomen: Soft, + BS.  Non tender, no guarding, rebound, hernias, masses. Lymphatics: Non tender without lymphadenopathy.  Musculoskeletal: Full ROM, 5/5 strength, normal gait.   Skin: Warm, dry without rashes, lesions, ecchymosis.  Neuro: Cranial nerves intact. Normal muscle tone, no cerebellar symptoms. Sensation intact.  Psych: Awake and oriented X 3, normal affect, Insight and Judgment appropriate.    Assessment:   No diagnosis found.  No orders of the defined types were placed in this encounter.   This note has been created with Education officer, environmental. Any transcriptional errors are unintentional.   Grayce Sessions, NP 05/01/2021, 9:17 AM

## 2021-05-01 NOTE — Patient Instructions (Signed)
Shoulder Pain °Many things can cause shoulder pain, including: °An injury to the shoulder. °Overuse of the shoulder. °Arthritis. °The source of the pain can be: °Inflammation. °An injury to the shoulder joint. °An injury to a tendon, ligament, or bone. °Follow these instructions at home: °Pay attention to changes in your symptoms. Let your health care provider know about them. Follow these instructions to relieve your pain. °If you have a sling: °Wear the sling as told by your health care provider. Remove it only as told by your health care provider. °Loosen the sling if your fingers tingle, become numb, or turn cold and blue. °Keep the sling clean. °If the sling is not waterproof: °Do not let it get wet. Remove it to shower or bathe. °Move your arm as little as possible, but keep your hand moving to prevent swelling. °Managing pain, stiffness, and swelling ° °If directed, put ice on the painful area: °Put ice in a plastic bag. °Place a towel between your skin and the bag. °Leave the ice on for 20 minutes, 2-3 times per day. Stop applying ice if it does not help with the pain. °Squeeze a soft ball or a foam pad as much as possible. This helps to keep the shoulder from swelling. It also helps to strengthen the arm. °General instructions °Take over-the-counter and prescription medicines only as told by your health care provider. °Keep all follow-up visits as told by your health care provider. This is important. °Contact a health care provider if: °Your pain gets worse. °Your pain is not relieved with medicines. °New pain develops in your arm, hand, or fingers. °Get help right away if: °Your arm, hand, or fingers: °Tingle. °Become numb. °Become swollen. °Become painful. °Turn white or blue. °Summary °Shoulder pain can be caused by an injury, overuse, or arthritis. °Pay attention to changes in your symptoms. Let your health care provider know about them. °This condition may be treated with a sling, ice, and pain  medicines. °Contact your health care provider if the pain gets worse or new pain develops. Get help right away if your arm, hand, or fingers tingle or become numb, swollen, or painful. °Keep all follow-up visits as told by your health care provider. This is important. °This information is not intended to replace advice given to you by your health care provider. Make sure you discuss any questions you have with your health care provider. °Document Revised: 05/09/2018 Document Reviewed: 05/09/2018 °[employer] Patient Education © 2022 [employer] ° °

## 2021-05-02 LAB — CBC WITH DIFFERENTIAL/PLATELET
Basophils Absolute: 0.1 10*3/uL (ref 0.0–0.2)
Basos: 1 %
EOS (ABSOLUTE): 0.1 10*3/uL (ref 0.0–0.4)
Eos: 1 %
Hematocrit: 47.7 % (ref 37.5–51.0)
Hemoglobin: 16.1 g/dL (ref 13.0–17.7)
Immature Grans (Abs): 0 10*3/uL (ref 0.0–0.1)
Immature Granulocytes: 0 %
Lymphocytes Absolute: 1.7 10*3/uL (ref 0.7–3.1)
Lymphs: 24 %
MCH: 30.7 pg (ref 26.6–33.0)
MCHC: 33.8 g/dL (ref 31.5–35.7)
MCV: 91 fL (ref 79–97)
Monocytes Absolute: 0.5 10*3/uL (ref 0.1–0.9)
Monocytes: 7 %
Neutrophils Absolute: 4.8 10*3/uL (ref 1.4–7.0)
Neutrophils: 67 %
Platelets: 265 10*3/uL (ref 150–450)
RBC: 5.25 x10E6/uL (ref 4.14–5.80)
RDW: 13.4 % (ref 11.6–15.4)
WBC: 7.2 10*3/uL (ref 3.4–10.8)

## 2021-05-02 LAB — CMP14+EGFR
ALT: 14 IU/L (ref 0–44)
AST: 15 IU/L (ref 0–40)
Albumin/Globulin Ratio: 1.8 (ref 1.2–2.2)
Albumin: 5.3 g/dL — ABNORMAL HIGH (ref 4.0–5.0)
Alkaline Phosphatase: 84 IU/L (ref 44–121)
BUN/Creatinine Ratio: 6 — ABNORMAL LOW (ref 9–20)
BUN: 6 mg/dL (ref 6–24)
Bilirubin Total: 0.4 mg/dL (ref 0.0–1.2)
CO2: 21 mmol/L (ref 20–29)
Calcium: 9.6 mg/dL (ref 8.7–10.2)
Chloride: 103 mmol/L (ref 96–106)
Creatinine, Ser: 1.02 mg/dL (ref 0.76–1.27)
Globulin, Total: 3 g/dL (ref 1.5–4.5)
Glucose: 104 mg/dL — ABNORMAL HIGH (ref 65–99)
Potassium: 4.4 mmol/L (ref 3.5–5.2)
Sodium: 141 mmol/L (ref 134–144)
Total Protein: 8.3 g/dL (ref 6.0–8.5)
eGFR: 94 mL/min/{1.73_m2} (ref >59)

## 2021-05-08 ENCOUNTER — Other Ambulatory Visit: Payer: Self-pay
